# Patient Record
Sex: Female | Born: 1940 | Race: White | Hispanic: No | Smoking: Former smoker
Health system: Southern US, Community
[De-identification: ages and names within clinical notes are randomized; demographics above are authoritative.]

## PROBLEM LIST (undated history)

## (undated) DIAGNOSIS — R3915 Urgency of urination: Secondary | ICD-10-CM

## (undated) DIAGNOSIS — I10 Essential (primary) hypertension: Secondary | ICD-10-CM

## (undated) DIAGNOSIS — K5730 Diverticulosis of large intestine without perforation or abscess without bleeding: Secondary | ICD-10-CM

## (undated) DIAGNOSIS — N201 Calculus of ureter: Secondary | ICD-10-CM

## (undated) DIAGNOSIS — E7800 Pure hypercholesterolemia, unspecified: Secondary | ICD-10-CM

## (undated) DIAGNOSIS — R3129 Other microscopic hematuria: Secondary | ICD-10-CM

## (undated) DIAGNOSIS — Z9889 Other specified postprocedural states: Secondary | ICD-10-CM

## (undated) DIAGNOSIS — Z8619 Personal history of other infectious and parasitic diseases: Secondary | ICD-10-CM

## (undated) DIAGNOSIS — M8580 Other specified disorders of bone density and structure, unspecified site: Secondary | ICD-10-CM

## (undated) DIAGNOSIS — Z8709 Personal history of other diseases of the respiratory system: Secondary | ICD-10-CM

## (undated) DIAGNOSIS — R350 Frequency of micturition: Secondary | ICD-10-CM

## (undated) DIAGNOSIS — Z85820 Personal history of malignant melanoma of skin: Secondary | ICD-10-CM

## (undated) HISTORY — DX: Essential (primary) hypertension: I10

## (undated) HISTORY — DX: Other specified disorders of bone density and structure, unspecified site: M85.80

## (undated) HISTORY — DX: Pure hypercholesterolemia, unspecified: E78.00

## (undated) HISTORY — PX: COSMETIC SURGERY: SHX468

---

## 1974-11-23 HISTORY — PX: OTHER SURGICAL HISTORY: SHX169

## 1981-11-23 HISTORY — PX: VAGINAL HYSTERECTOMY: SUR661

## 1998-06-28 ENCOUNTER — Other Ambulatory Visit: Admission: RE | Admit: 1998-06-28 | Discharge: 1998-06-28 | Payer: Self-pay | Admitting: Obstetrics and Gynecology

## 1999-08-20 ENCOUNTER — Ambulatory Visit (HOSPITAL_BASED_OUTPATIENT_CLINIC_OR_DEPARTMENT_OTHER): Admission: RE | Admit: 1999-08-20 | Discharge: 1999-08-20 | Payer: Self-pay

## 1999-10-01 ENCOUNTER — Other Ambulatory Visit: Admission: RE | Admit: 1999-10-01 | Discharge: 1999-10-01 | Payer: Self-pay | Admitting: Obstetrics and Gynecology

## 1999-11-24 HISTORY — PX: BREAST BIOPSY: SHX20

## 2000-11-19 ENCOUNTER — Other Ambulatory Visit: Admission: RE | Admit: 2000-11-19 | Discharge: 2000-11-19 | Payer: Self-pay | Admitting: Obstetrics and Gynecology

## 2001-08-30 ENCOUNTER — Encounter: Payer: Self-pay | Admitting: Obstetrics and Gynecology

## 2001-08-30 ENCOUNTER — Encounter: Admission: RE | Admit: 2001-08-30 | Discharge: 2001-08-30 | Payer: Self-pay | Admitting: Obstetrics and Gynecology

## 2001-12-27 ENCOUNTER — Other Ambulatory Visit: Admission: RE | Admit: 2001-12-27 | Discharge: 2001-12-27 | Payer: Self-pay | Admitting: Obstetrics and Gynecology

## 2002-09-05 ENCOUNTER — Encounter: Admission: RE | Admit: 2002-09-05 | Discharge: 2002-09-05 | Payer: Self-pay | Admitting: Obstetrics and Gynecology

## 2002-09-05 ENCOUNTER — Encounter: Payer: Self-pay | Admitting: Obstetrics and Gynecology

## 2002-09-11 ENCOUNTER — Observation Stay (HOSPITAL_COMMUNITY): Admission: RE | Admit: 2002-09-11 | Discharge: 2002-09-12 | Payer: Self-pay | Admitting: Urology

## 2002-09-11 HISTORY — PX: BLADDER SUSPENSION: SHX72

## 2003-09-11 ENCOUNTER — Encounter: Payer: Self-pay | Admitting: Obstetrics and Gynecology

## 2003-09-11 ENCOUNTER — Encounter: Admission: RE | Admit: 2003-09-11 | Discharge: 2003-09-11 | Payer: Self-pay | Admitting: Obstetrics and Gynecology

## 2004-09-15 ENCOUNTER — Encounter: Admission: RE | Admit: 2004-09-15 | Discharge: 2004-09-15 | Payer: Self-pay | Admitting: Obstetrics and Gynecology

## 2004-11-23 HISTORY — PX: CATARACT EXTRACTION W/ INTRAOCULAR LENS  IMPLANT, BILATERAL: SHX1307

## 2005-10-01 ENCOUNTER — Encounter: Admission: RE | Admit: 2005-10-01 | Discharge: 2005-10-01 | Payer: Self-pay | Admitting: Obstetrics and Gynecology

## 2006-04-21 ENCOUNTER — Other Ambulatory Visit: Admission: RE | Admit: 2006-04-21 | Discharge: 2006-04-21 | Payer: Self-pay | Admitting: Obstetrics and Gynecology

## 2006-10-13 ENCOUNTER — Encounter: Admission: RE | Admit: 2006-10-13 | Discharge: 2006-10-13 | Payer: Self-pay | Admitting: Obstetrics and Gynecology

## 2007-10-17 ENCOUNTER — Encounter: Admission: RE | Admit: 2007-10-17 | Discharge: 2007-10-17 | Payer: Self-pay | Admitting: Obstetrics and Gynecology

## 2008-05-16 ENCOUNTER — Other Ambulatory Visit: Admission: RE | Admit: 2008-05-16 | Discharge: 2008-05-16 | Payer: Self-pay | Admitting: Obstetrics and Gynecology

## 2008-08-30 ENCOUNTER — Encounter: Admission: RE | Admit: 2008-08-30 | Discharge: 2008-08-30 | Payer: Self-pay | Admitting: Geriatric Medicine

## 2008-09-05 ENCOUNTER — Ambulatory Visit (HOSPITAL_COMMUNITY): Admission: RE | Admit: 2008-09-05 | Discharge: 2008-09-05 | Payer: Self-pay | Admitting: Geriatric Medicine

## 2008-10-24 ENCOUNTER — Encounter: Admission: RE | Admit: 2008-10-24 | Discharge: 2008-10-24 | Payer: Self-pay | Admitting: Obstetrics and Gynecology

## 2009-02-28 ENCOUNTER — Ambulatory Visit: Payer: Self-pay | Admitting: Internal Medicine

## 2009-03-21 ENCOUNTER — Ambulatory Visit: Payer: Self-pay | Admitting: Internal Medicine

## 2009-03-21 ENCOUNTER — Encounter: Payer: Self-pay | Admitting: Internal Medicine

## 2009-03-21 HISTORY — PX: COLONOSCOPY: SHX174

## 2009-03-23 ENCOUNTER — Encounter: Payer: Self-pay | Admitting: Internal Medicine

## 2009-05-22 ENCOUNTER — Telehealth: Payer: Self-pay | Admitting: Internal Medicine

## 2009-05-22 ENCOUNTER — Ambulatory Visit: Payer: Self-pay | Admitting: Obstetrics and Gynecology

## 2009-10-28 ENCOUNTER — Encounter: Admission: RE | Admit: 2009-10-28 | Discharge: 2009-10-28 | Payer: Self-pay | Admitting: Obstetrics and Gynecology

## 2010-05-30 ENCOUNTER — Other Ambulatory Visit: Admission: RE | Admit: 2010-05-30 | Discharge: 2010-05-30 | Payer: Self-pay | Admitting: Obstetrics and Gynecology

## 2010-05-30 ENCOUNTER — Ambulatory Visit: Payer: Self-pay | Admitting: Obstetrics and Gynecology

## 2010-10-29 ENCOUNTER — Encounter
Admission: RE | Admit: 2010-10-29 | Discharge: 2010-10-29 | Payer: Self-pay | Source: Home / Self Care | Admitting: Obstetrics and Gynecology

## 2011-04-10 NOTE — Op Note (Signed)
NAME:  Kendra Cochran, Kendra Cochran                       ACCOUNT NO.:  [id number]   MEDICAL RECORD NO.:  [id number]                   PATIENT TYPE:  AMB   LOCATION:  DAY                                  FACILITY:  [facility]   PHYSICIAN:  Sigmund I. Patsi Sears, M.D.         DATE OF BIRTH:  [birth year]   DATE OF PROCEDURE:  09/11/2002  DATE OF DISCHARGE:                                 OPERATIVE REPORT   PREOPERATIVE DIAGNOSIS:  Urinary incontinence.   POSTOPERATIVE DIAGNOSIS:  Urinary incontinence.   OPERATION:  Cystourethroscopy and SPARC pubovaginal sling.   SURGEON:  Sigmund I. Patsi Sears, M.D.   ANESTHESIA:  General (LMA).   PREPARATION:  After appropriate preanesthesia, the patient is brought to the  operating room and placed on the operating table in the dorsal supine  position where general LMA anesthesia was introduced.  She was then re-  placed in the dorsal lithotomy position where B&O suppository was placed,  and the patient was prepped with Betadine solution and draped in the usual  fashion.   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  Because of the patient's  history of urinary incontinence, it was selected to proceed with [facility]  urethropexy, as previously planned, and 5 cc of Marcaine with epinephrine  was injected into the pubovaginal and cervical arch tissue.  In the midline,  a 2 cm incision was made, and the subcutaneous tissue was dissected with  sharp and blunt dissection on either side of the urethra.  Two separate  incisions were made two fingerbreadths above the pubic tubercle and the  SPARC needles passed.  Cystoscopy was accomplished and showed no evidence of  any SPARC needle within the bladder.  The [facility] system was placed without  difficulty.  The patient tolerated this portion of the procedure well.  A  right-angle  clamp was placed behind the sling to allow tension adjustment  and to be sure that the sling was not too tight.  The sling was then cut in  the usual fashion below the level of the subcutaneous tissue.  Cystoscopy  again revealed no evidence of the sling within the bladder.  There was some  skin edge bleeding in the vaginal epithelium, and this was stopped with  running closure of the vaginal incision with 2-0 Vicryl suture.  Following  this, the packing was placed, Foley catheter placed, and the suprapubic skin  edges taped with Benzoin and Steri-Strips.  The patient was then awakened  and taken to the recovery room in good condition.  She was given IV Ancef at  the end of the case, IV Toradol at the end of the case, B&O suppository.  Sigmund I. Patsi Sears, M.D.    SIT/MEDQ  D:  09/11/2002  T:  09/11/2002  Job:  [id number]

## 2011-05-08 ENCOUNTER — Other Ambulatory Visit: Payer: Self-pay | Admitting: Dermatology

## 2011-06-03 ENCOUNTER — Other Ambulatory Visit (HOSPITAL_COMMUNITY)
Admission: RE | Admit: 2011-06-03 | Discharge: 2011-06-03 | Disposition: A | Discharge status: Home / Self Care | Payer: Medicare Other | Source: Ambulatory Visit | Attending: Obstetrics and Gynecology | Admitting: Obstetrics and Gynecology

## 2011-06-03 ENCOUNTER — Other Ambulatory Visit: Payer: Self-pay | Admitting: Obstetrics and Gynecology

## 2011-06-03 ENCOUNTER — Encounter (INDEPENDENT_AMBULATORY_CARE_PROVIDER_SITE_OTHER): Payer: Medicare Other | Admitting: Obstetrics and Gynecology

## 2011-06-03 DIAGNOSIS — Z124 Encounter for screening for malignant neoplasm of cervix: Secondary | ICD-10-CM | POA: Insufficient documentation

## 2011-06-03 DIAGNOSIS — R82998 Other abnormal findings in urine: Secondary | ICD-10-CM

## 2011-06-03 DIAGNOSIS — M810 Age-related osteoporosis without current pathological fracture: Secondary | ICD-10-CM

## 2011-06-03 DIAGNOSIS — N393 Stress incontinence (female) (male): Secondary | ICD-10-CM

## 2011-06-03 DIAGNOSIS — N952 Postmenopausal atrophic vaginitis: Secondary | ICD-10-CM

## 2011-09-24 ENCOUNTER — Other Ambulatory Visit: Payer: Self-pay | Admitting: Obstetrics and Gynecology

## 2011-09-24 DIAGNOSIS — Z1231 Encounter for screening mammogram for malignant neoplasm of breast: Secondary | ICD-10-CM

## 2011-11-02 ENCOUNTER — Ambulatory Visit
Admission: RE | Admit: 2011-11-02 | Discharge: 2011-11-02 | Disposition: A | Discharge status: Home / Self Care | Payer: Medicare Other | Source: Ambulatory Visit | Attending: Obstetrics and Gynecology | Admitting: Obstetrics and Gynecology

## 2011-11-02 DIAGNOSIS — Z1231 Encounter for screening mammogram for malignant neoplasm of breast: Secondary | ICD-10-CM

## 2012-05-09 ENCOUNTER — Other Ambulatory Visit: Payer: Self-pay | Admitting: Dermatology

## 2012-05-16 ENCOUNTER — Other Ambulatory Visit: Payer: Self-pay | Admitting: *Deleted

## 2012-05-16 MED ORDER — ZOLPIDEM TARTRATE 10 MG PO TABS: 10.0000 mg | ORAL_TABLET | Freq: Every evening | ORAL | Status: DC | PRN

## 2012-05-16 NOTE — Telephone Encounter (Signed)
Refill called in to [place] 

## 2012-05-25 ENCOUNTER — Encounter: Payer: Self-pay | Admitting: Obstetrics and Gynecology

## 2012-05-25 DIAGNOSIS — N946 Dysmenorrhea, unspecified: Secondary | ICD-10-CM | POA: Insufficient documentation

## 2012-05-25 DIAGNOSIS — C439 Malignant melanoma of skin, unspecified: Secondary | ICD-10-CM | POA: Insufficient documentation

## 2012-06-17 ENCOUNTER — Other Ambulatory Visit: Payer: Self-pay | Admitting: Dermatology

## 2012-06-21 ENCOUNTER — Encounter: Payer: Medicare Other | Admitting: Obstetrics and Gynecology

## 2012-06-30 ENCOUNTER — Ambulatory Visit (INDEPENDENT_AMBULATORY_CARE_PROVIDER_SITE_OTHER): Payer: Medicare Other | Admitting: Obstetrics and Gynecology

## 2012-06-30 ENCOUNTER — Encounter: Payer: Self-pay | Admitting: Obstetrics and Gynecology

## 2012-06-30 VITALS — BP 120/76 | Ht 60.0 in | Wt 107.0 lb

## 2012-06-30 DIAGNOSIS — Z780 Asymptomatic menopausal state: Secondary | ICD-10-CM

## 2012-06-30 DIAGNOSIS — N393 Stress incontinence (female) (male): Secondary | ICD-10-CM

## 2012-06-30 DIAGNOSIS — N952 Postmenopausal atrophic vaginitis: Secondary | ICD-10-CM

## 2012-06-30 DIAGNOSIS — M899 Disorder of bone, unspecified: Secondary | ICD-10-CM

## 2012-06-30 DIAGNOSIS — M8580 Other specified disorders of bone density and structure, unspecified site: Secondary | ICD-10-CM

## 2012-06-30 MED ORDER — ZOLPIDEM TARTRATE 10 MG PO TABS: 10.0000 mg | ORAL_TABLET | Freq: Every evening | ORAL | Status: DC | PRN

## 2012-06-30 MED ORDER — DIAZEPAM 5 MG PO TABS: 5.0000 mg | ORAL_TABLET | Freq: Four times a day (QID) | ORAL | Status: AC | PRN

## 2012-06-30 NOTE — Patient Instructions (Signed)
Schedule bone density.    

## 2012-06-30 NOTE — Progress Notes (Signed)
The patient came to see me today for further follow up. She has had osteoporosis on bone density. She was treated with biphosphonate's for 10 years and now on drug holiday for 2 years. Her last bone density was 2011 and was now osteopenia. She is due for followup bone density. She is up-to-date on mammograms. She has significant sleep disturbance which started after her husband's death. She uses one third of the 10 mg Ambien with excellent results. She also uses occasional Valium for stress. She does have urinary stress incontinence which she feels is tolerable. She does not have urgency, frequency or dysuria. She is having no vaginal bleeding. She is having no pelvic pain. She had a vaginal hysterectomy for dysmenorrhea. She has always had normal Pap smears. Her last Pap smear was 2012. She does have atrophic vaginitis but is not sexually active.  ROS: 12 system review done. Pertinent positives above.  HEENT: Within normal limits. Neck: No masses. Supraclavicular lymph nodes: Not enlarged. Breasts: Examined in both sitting and lying position. Symmetrical without skin changes or masses. Abdomen: Soft no masses guarding or rebound. No hernias. Pelvic: External within normal limits. BUS within normal limits. Vaginal examination shows poor  estrogen effect, no cystocele enterocele or rectocele. Cervix and uterus absent. Adnexa within normal limits. Rectovaginal confirmatory. Extremities within normal limits.  Assessment: #1. Sleep disturbance-probably stress rather than menopausal #2. Atrophic vaginitis #3. Osteopenia #4. Urinary stress incontinence  Plan: Continue yearly mammograms. Bone density. Continue Ambien as needed.

## 2012-07-06 ENCOUNTER — Encounter: Payer: Self-pay | Admitting: Obstetrics and Gynecology

## 2012-09-23 ENCOUNTER — Other Ambulatory Visit: Payer: Self-pay | Admitting: Obstetrics and Gynecology

## 2012-09-23 DIAGNOSIS — Z1231 Encounter for screening mammogram for malignant neoplasm of breast: Secondary | ICD-10-CM

## 2012-09-23 DIAGNOSIS — M8580 Other specified disorders of bone density and structure, unspecified site: Secondary | ICD-10-CM

## 2012-09-23 HISTORY — DX: Other specified disorders of bone density and structure, unspecified site: M85.80

## 2012-10-06 ENCOUNTER — Encounter: Payer: Self-pay | Admitting: Obstetrics and Gynecology

## 2012-11-02 ENCOUNTER — Ambulatory Visit
Admission: RE | Admit: 2012-11-02 | Discharge: 2012-11-02 | Disposition: A | Discharge status: Home / Self Care | Payer: Medicare Other | Source: Ambulatory Visit | Attending: Obstetrics and Gynecology | Admitting: Obstetrics and Gynecology

## 2012-11-02 DIAGNOSIS — Z1231 Encounter for screening mammogram for malignant neoplasm of breast: Secondary | ICD-10-CM

## 2012-11-04 ENCOUNTER — Telehealth: Payer: Self-pay | Admitting: *Deleted

## 2012-11-04 NOTE — Telephone Encounter (Signed)
Pt informed with normal mammogram results for 11/02/12.

## 2013-02-20 ENCOUNTER — Other Ambulatory Visit: Payer: Self-pay | Admitting: Sports Medicine

## 2013-02-20 DIAGNOSIS — M545 Low back pain: Secondary | ICD-10-CM

## 2013-02-22 ENCOUNTER — Ambulatory Visit
Admission: RE | Admit: 2013-02-22 | Discharge: 2013-02-22 | Disposition: A | Discharge status: Home / Self Care | Payer: Medicare Other | Source: Ambulatory Visit | Attending: Sports Medicine | Admitting: Sports Medicine

## 2013-02-22 DIAGNOSIS — M545 Low back pain: Secondary | ICD-10-CM

## 2013-04-14 ENCOUNTER — Other Ambulatory Visit: Payer: Self-pay

## 2013-04-14 MED ORDER — ZOLPIDEM TARTRATE 10 MG PO TABS: 10.0000 mg | ORAL_TABLET | Freq: Every evening | ORAL | Status: DC | PRN

## 2013-04-14 NOTE — Telephone Encounter (Signed)
Patient is former patient of Dr. Verl Dicker. She is current with her yearly having had her last one in August 2013.  At that CE visit Dr. Reece Agar wrote: Assessment: #1. Sleep disturbance-probably stress rather than menopausal #2. Atrophic vaginitis #3. Osteopenia #4. Urinary stress incontinence  Plan: Continue yearly mammograms. Bone density. Continue Ambien as needed.            Medications Ordered This Encounter      Disp Refills Start End   zolpidem (AMBIEN) 10 MG tablet 30 tablet 4 06/30/2012    Take 1 tablet (10 mg total) by mouth at bedtime as needed. - Oral

## 2013-04-14 NOTE — Telephone Encounter (Signed)
Phoned in to pharmacy. 

## 2013-08-21 ENCOUNTER — Encounter: Payer: Self-pay | Admitting: Gynecology

## 2013-08-21 ENCOUNTER — Ambulatory Visit (INDEPENDENT_AMBULATORY_CARE_PROVIDER_SITE_OTHER): Payer: Medicare Other | Admitting: Gynecology

## 2013-08-21 VITALS — BP 110/70 | Ht 60.0 in | Wt 114.0 lb

## 2013-08-21 DIAGNOSIS — M8580 Other specified disorders of bone density and structure, unspecified site: Secondary | ICD-10-CM

## 2013-08-21 DIAGNOSIS — G4700 Insomnia, unspecified: Secondary | ICD-10-CM

## 2013-08-21 DIAGNOSIS — N899 Noninflammatory disorder of vagina, unspecified: Secondary | ICD-10-CM

## 2013-08-21 DIAGNOSIS — N898 Other specified noninflammatory disorders of vagina: Secondary | ICD-10-CM

## 2013-08-21 DIAGNOSIS — N952 Postmenopausal atrophic vaginitis: Secondary | ICD-10-CM

## 2013-08-21 DIAGNOSIS — M899 Disorder of bone, unspecified: Secondary | ICD-10-CM

## 2013-08-21 LAB — URINALYSIS W MICROSCOPIC + REFLEX CULTURE
Bilirubin Urine: NEGATIVE
Casts: NONE SEEN
Crystals: NONE SEEN
Nitrite: NEGATIVE
Urobilinogen, UA: 0.2 mg/dL (ref 0.0–1.0)
pH: 5.5 (ref 5.0–8.0)

## 2013-08-21 MED ORDER — DIAZEPAM 5 MG PO TABS: 5.0000 mg | ORAL_TABLET | Freq: Four times a day (QID) | ORAL | Status: DC | PRN

## 2013-08-21 MED ORDER — ZOLPIDEM TARTRATE 10 MG PO TABS: 10.0000 mg | ORAL_TABLET | Freq: Every evening | ORAL | Status: DC | PRN

## 2013-08-21 NOTE — Progress Notes (Signed)
Kendra Cochran 12/01/1940 [id number]        72 y.o.  [id number] for followup exam.  Former patient of Dr. Eda Cochran. Several issues noted below.  Past medical history,surgical history, medications, allergies, family history and social history were all reviewed and documented in the EPIC chart.  ROS:  Performed and pertinent positives and negatives are included in the history, assessment and plan .  Exam: Kendra Cochran assistant Filed Vitals:   08/21/13 0925  BP: 110/70  Height: 5' (1.524 m)  Weight: 114 lb (51.71 kg)   General appearance  Normal Skin grossly normal Head/Neck normal with no cervical or supraclavicular adenopathy thyroid normal Lungs  clear Cardiac RR, without RMG Abdominal  soft, nontender, without masses, organomegaly or hernia Breasts  examined lying and sitting without masses, retractions, discharge or axillary adenopathy. Pelvic  Ext/BUS/vagina  atrophic genital changes  Adnexa  Without masses or tenderness    Anus and perineum  normal   Rectovaginal  normal sphincter tone without palpated masses or tenderness.    Assessment/Plan:  72 y.o. [id number] female for followup exam  1. Atrophic genital changes. Status post TVH for dysmenorrhea. Patient is complaining of some vaginal irritation, low level. No discharge, odor, urinary symptoms such as frequency dysuria or urgency. Exam shows significant atrophy without evidence of infection. We'll check urinalysis. Discussed options to include OTC moisturizers such as Replens, vaginal estrogen such as Estrace cream or Vagifem and Osphena. The pros/cons, risks/benefits of each choice discussed including issues of absorption and increased risk of blood clots such as stroke heart attack DVT in the breast cancer issue. Patient prefers to try Replens. She will call back if she wants to consider trial of Vagifem. 2. Osteopenia. Last bone density 09/2012 with T score -2.3. Had been labeled as osteoporotic in Dr. Verl Cochran last note although  review of all of her DEXA's in her chart showed the lowest T score -2.4. Had been on bisphosphonate for 10 years and now is 3 years off of this for a drug-free holiday again per Dr. Verl Cochran note. 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 Recommend repeat DEXA next year to year interval. Increase calcium vitamin D reviewed. 3. Urinary incontinence. Patient has some loss of urine particularly after finishing voiding when she stands she still lives additional little bit of urine. No real SUI symptoms. She is status post sling 2003. Is not interested in following up with urology in reference to this at this time. We'll check urinalysis. Otherwise continue to follow. 4. Mammography 10/2012. Patient will continue with annual mammography. SBE monthly reviewed. 5. Pap smear 2012. Status post hysterectomy for benign indications. Over the age of 28. No history of abnormal Pap smears. Options to stop screening altogether reviewed the patient is comfortable with this. 6. Colonoscopy 2010. Repeat at their recommended interval. 7. Insomnia. Since the death of her husband several years ago. Uses a third of a 10 mg Ambien as needed. Also uses Valium 5 mg when she travels intermittently for anxiety and sleep. Has been doing this for years with good results. Refill Ambien 10 mg #30 and Valium 5 mg #30 both with 2 refills. 8. Health maintenance. No blood work done as this is all done through her primary physician's office. Followup one year, sooner as needed.  Note: This document was prepared with digital dictation and possible smart phrase technology. Any transcriptional errors that result from this process are unintentional.   Kendra Lords MD, 9:56 AM  08/21/2013   

## 2013-08-21 NOTE — Patient Instructions (Signed)
Follow up in one year, sooner as needed. 

## 2013-08-23 LAB — URINE CULTURE: Colony Count: 2000

## 2013-12-01 ENCOUNTER — Encounter: Payer: Self-pay | Admitting: Gynecology

## 2014-09-24 ENCOUNTER — Encounter: Payer: Self-pay | Admitting: Gynecology

## 2014-10-31 ENCOUNTER — Encounter: Payer: Medicare Other | Admitting: Gynecology

## 2014-11-01 ENCOUNTER — Encounter: Payer: Self-pay | Admitting: Gynecology

## 2014-11-01 ENCOUNTER — Ambulatory Visit (INDEPENDENT_AMBULATORY_CARE_PROVIDER_SITE_OTHER): Payer: Medicare Other | Admitting: Gynecology

## 2014-11-01 ENCOUNTER — Other Ambulatory Visit: Payer: Self-pay | Admitting: Gynecology

## 2014-11-01 ENCOUNTER — Telehealth: Payer: Self-pay | Admitting: *Deleted

## 2014-11-01 ENCOUNTER — Ambulatory Visit (INDEPENDENT_AMBULATORY_CARE_PROVIDER_SITE_OTHER): Payer: Medicare Other

## 2014-11-01 DIAGNOSIS — R102 Pelvic and perineal pain: Secondary | ICD-10-CM

## 2014-11-01 DIAGNOSIS — G4700 Insomnia, unspecified: Secondary | ICD-10-CM

## 2014-11-01 DIAGNOSIS — N8331 Acquired atrophy of ovary: Secondary | ICD-10-CM

## 2014-11-01 DIAGNOSIS — N83202 Unspecified ovarian cyst, left side: Secondary | ICD-10-CM

## 2014-11-01 DIAGNOSIS — N200 Calculus of kidney: Secondary | ICD-10-CM

## 2014-11-01 DIAGNOSIS — N83319 Acquired atrophy of ovary, unspecified side: Secondary | ICD-10-CM

## 2014-11-01 DIAGNOSIS — N8320 Unspecified ovarian cysts: Secondary | ICD-10-CM

## 2014-11-01 LAB — URINALYSIS W MICROSCOPIC + REFLEX CULTURE
Bilirubin Urine: NEGATIVE
Casts: NONE SEEN
Crystals: NONE SEEN
GLUCOSE, UA: NEGATIVE mg/dL
Ketones, ur: NEGATIVE mg/dL
NITRITE: NEGATIVE
PROTEIN: NEGATIVE mg/dL
SPECIFIC GRAVITY, URINE: 1.02 (ref 1.005–1.030)
UROBILINOGEN UA: 0.2 mg/dL (ref 0.0–1.0)
pH: 6 (ref 5.0–8.0)

## 2014-11-01 MED ORDER — CIPROFLOXACIN HCL 250 MG PO TABS: 250.0000 mg | ORAL_TABLET | Freq: Two times a day (BID) | ORAL | Status: DC

## 2014-11-01 MED ORDER — DIAZEPAM 5 MG PO TABS: 5.0000 mg | ORAL_TABLET | Freq: Four times a day (QID) | ORAL | Status: DC | PRN

## 2014-11-01 MED ORDER — ZOLPIDEM TARTRATE 10 MG PO TABS: 10.0000 mg | ORAL_TABLET | Freq: Every evening | ORAL | Status: DC | PRN

## 2014-11-01 NOTE — Telephone Encounter (Signed)
Referral form faxed to [facility] they will fax me back with time and date of appointment.

## 2014-11-01 NOTE — Telephone Encounter (Signed)
-----   Message from Anastasio Auerbach, MD sent at 11/01/2014  2:50 PM EST ----- Patient needs appointment to see urologist. Preferably Dr. Gaynelle Arabian as she has seen him before. Reference probable renal lithiasis with ongoing blood in her urine.

## 2014-11-01 NOTE — Addendum Note (Signed)
Addended by: Anastasio Auerbach on: 11/01/2014 04:05 PM   Modules accepted: Orders, Level of Service

## 2014-11-01 NOTE — Patient Instructions (Signed)
Follow up for ultrasound as scheduled. Office will arrange appointment with urologist. Take antibiotic twice daily for 7 days.

## 2014-11-01 NOTE — Progress Notes (Addendum)
Kendra Cochran 01-01-1941 [id number]        73 y.o.  [id number] patient presents  Complaining of both left lower and right lower quadrant pain and some low back pain. Initially had crippling low back to left lower quadrant pain 2 weeks ago. Noticed blood-tinged urine. Symptoms seem to get better but now have waxed and waned over the last week or 2. No longer having blood in her urine. No constipation diarrhea. No fever chills nausea vomiting. Eating without difficulty. No history of renal lithiasis. Status post TVH in the past.  Past medical history,surgical history, problem list, medications, allergies, family history and social history were all reviewed and documented in the EPIC chart.  Directed ROS with pertinent positives and negatives documented in the history of present illness/assessment and plan.  Exam: [name] General appearance:  Normal Spine straight without CVA tenderness Abdomen soft nontender without masses guarding rebound organomegaly Pelvic external BS vagina with atrophic changes. Bimanual without masses or tenderness. Rectovaginal exam is normal.  Urinalysis shows rare squamous, 3-6 WBC, TNTC RBC, few bacteria  Assessment/Plan:  73 y.o. [id number] with history and urinalysis suspicious for renal lithiasis. Will cover with 50 mg twice a day 7 days. Follow up appointment with urologist for further evaluation. Schedule ultrasound here to rule out ovarian process as patient raised the concern about ovarian cancer. Issues with urologic cancers also reviewed with need to see the urologist for evaluation discussed.   Addendum: Patient was able to arrange ultrasound today. Ultrasound shows right ovary atrophic. Left ovary with some calcifications and irregular shaped echo-free cyst 10 mm mean avascular. Minimal fluid in the cul-de-sac.  Assessment and plan: Probable physiologic changes left ovary persistent from premenopausal. Simple small avascular cyst. Doubt responsible for  her pain. For completeness I recommend repeat ultrasound in 2 months. Will start on the antibiotics and see the urologist as noted above. Various scenarios to include enlarging cyst requiring surgery or laparoscopy due to persistent pain despite negative urologic evaluation also discussed.  Patient will schedule her ultrasound today for 2 months from now.  Anastasio Auerbach MD, 2:46 PM 11/01/2014

## 2014-11-03 LAB — URINE CULTURE
Colony Count: NO GROWTH
ORGANISM ID, BACTERIA: NO GROWTH

## 2014-11-05 NOTE — Telephone Encounter (Signed)
Appointment on 12/17/14 @ 3:30 pt informed with this as well.

## 2014-11-05 NOTE — Telephone Encounter (Signed)
Dr.Fontaine pt asked if she could have something for pain as well? Pt said she is having some lower slight discomfort and asked if she could have Rx for pain to have on hand? Please advise

## 2014-11-06 MED ORDER — IBUPROFEN 800 MG PO TABS: 800.0000 mg | ORAL_TABLET | Freq: Three times a day (TID) | ORAL | Status: AC | PRN

## 2014-11-06 NOTE — Telephone Encounter (Signed)
Ibuprofen 800 mg #30 one by mouth every 8 hour period

## 2014-11-06 NOTE — Telephone Encounter (Signed)
Rx sent to [facility]aramacy.  

## 2014-11-10 ENCOUNTER — Encounter (HOSPITAL_COMMUNITY): Payer: Self-pay

## 2014-11-10 ENCOUNTER — Emergency Department (HOSPITAL_COMMUNITY)
Admission: EM | Admit: 2014-11-10 | Discharge: 2014-11-10 | Disposition: A | Discharge status: Home / Self Care | Payer: Medicare Other | Attending: Emergency Medicine | Admitting: Emergency Medicine

## 2014-11-10 ENCOUNTER — Emergency Department (HOSPITAL_COMMUNITY): Payer: Medicare Other

## 2014-11-10 DIAGNOSIS — Z85820 Personal history of malignant melanoma of skin: Secondary | ICD-10-CM | POA: Insufficient documentation

## 2014-11-10 DIAGNOSIS — N200 Calculus of kidney: Secondary | ICD-10-CM | POA: Insufficient documentation

## 2014-11-10 DIAGNOSIS — M8580 Other specified disorders of bone density and structure, unspecified site: Secondary | ICD-10-CM | POA: Diagnosis not present

## 2014-11-10 DIAGNOSIS — Z792 Long term (current) use of antibiotics: Secondary | ICD-10-CM | POA: Insufficient documentation

## 2014-11-10 DIAGNOSIS — Z87891 Personal history of nicotine dependence: Secondary | ICD-10-CM | POA: Diagnosis not present

## 2014-11-10 DIAGNOSIS — Z79899 Other long term (current) drug therapy: Secondary | ICD-10-CM | POA: Diagnosis not present

## 2014-11-10 DIAGNOSIS — R109 Unspecified abdominal pain: Secondary | ICD-10-CM

## 2014-11-10 LAB — CBC WITH DIFFERENTIAL/PLATELET
Basophils Absolute: 0 10*3/uL (ref 0.0–0.1)
Basophils Relative: 0 % (ref 0–1)
EOS ABS: 0.1 10*3/uL (ref 0.0–0.7)
Eosinophils Relative: 2 % (ref 0–5)
HEMATOCRIT: 43.6 % (ref 36.0–46.0)
HEMOGLOBIN: 14.6 g/dL (ref 12.0–15.0)
LYMPHS ABS: 1.4 10*3/uL (ref 0.7–4.0)
Lymphocytes Relative: 25 % (ref 12–46)
MCH: 30.8 pg (ref 26.0–34.0)
MCHC: 33.5 g/dL (ref 30.0–36.0)
MCV: 92 fL (ref 78.0–100.0)
MONO ABS: 0.3 10*3/uL (ref 0.1–1.0)
MONOS PCT: 5 % (ref 3–12)
Neutro Abs: 3.8 10*3/uL (ref 1.7–7.7)
Neutrophils Relative %: 68 % (ref 43–77)
Platelets: 144 10*3/uL — ABNORMAL LOW (ref 150–400)
RBC: 4.74 MIL/uL (ref 3.87–5.11)
RDW: 13.2 % (ref 11.5–15.5)
WBC: 5.5 10*3/uL (ref 4.0–10.5)

## 2014-11-10 LAB — COMPREHENSIVE METABOLIC PANEL
ALBUMIN: 4.4 g/dL (ref 3.5–5.2)
ALT: 20 U/L (ref 0–35)
ANION GAP: 13 (ref 5–15)
AST: 23 U/L (ref 0–37)
Alkaline Phosphatase: 57 U/L (ref 39–117)
BILIRUBIN TOTAL: 0.4 mg/dL (ref 0.3–1.2)
BUN: 18 mg/dL (ref 6–23)
CO2: 25 mEq/L (ref 19–32)
CREATININE: 1.1 mg/dL (ref 0.50–1.10)
Calcium: 10.2 mg/dL (ref 8.4–10.5)
Chloride: 104 mEq/L (ref 96–112)
GFR calc non Af Amer: 48 mL/min — ABNORMAL LOW (ref >90)
GFR, EST AFRICAN AMERICAN: 55 mL/min — AB (ref >90)
GLUCOSE: 102 mg/dL — AB (ref 70–99)
Potassium: 4.1 mEq/L (ref 3.7–5.3)
Sodium: 142 mEq/L (ref 137–147)
Total Protein: 7.3 g/dL (ref 6.0–8.3)

## 2014-11-10 LAB — URINALYSIS, ROUTINE W REFLEX MICROSCOPIC
Bilirubin Urine: NEGATIVE
GLUCOSE, UA: NEGATIVE mg/dL
KETONES UR: NEGATIVE mg/dL
Nitrite: NEGATIVE
PH: 5.5 (ref 5.0–8.0)
Protein, ur: NEGATIVE mg/dL
Specific Gravity, Urine: 1.026 (ref 1.005–1.030)
Urobilinogen, UA: 0.2 mg/dL (ref 0.0–1.0)

## 2014-11-10 LAB — URINE MICROSCOPIC-ADD ON

## 2014-11-10 MED ORDER — TAMSULOSIN HCL 0.4 MG PO CAPS: 0.4000 mg | ORAL_CAPSULE | Freq: Every day | ORAL | Status: DC

## 2014-11-10 MED ORDER — HYDROCODONE-ACETAMINOPHEN 5-325 MG PO TABS
2.0000 | ORAL_TABLET | Freq: Once | ORAL | Status: AC
  Administered 2014-11-10: 2 via ORAL
  Filled 2014-11-10: qty 2

## 2014-11-10 MED ORDER — HYDROCODONE-ACETAMINOPHEN 5-325 MG PO TABS: 1.0000 | ORAL_TABLET | Freq: Four times a day (QID) | ORAL | Status: DC | PRN

## 2014-11-10 NOTE — ED Provider Notes (Signed)
CSN: [id number]     Arrival date & time 11/10/14  1100 History   First MD Initiated Contact with Patient 11/10/14 1140     Chief Complaint  Patient presents with  . Flank Pain  . Abdominal Pain     (Consider location/radiation/quality/duration/timing/severity/associated sxs/prior Treatment) Patient is a 73 y.o. female presenting with flank pain and abdominal pain. The history is provided by the patient.  Flank Pain This is a recurrent problem. The current episode started more than 1 week ago. The problem occurs every several days. The problem has not changed since onset.Associated symptoms include abdominal pain. Pertinent negatives include no shortness of breath. Nothing aggravates the symptoms. Nothing relieves the symptoms. She has tried nothing for the symptoms.  Abdominal Pain Pain location:  L flank and R flank Pain quality: aching and sharp   Pain radiates to:  Groin Associated symptoms: no cough, no diarrhea, no fever, no nausea, no shortness of breath and no vomiting     Past Medical History  Diagnosis Date  . Melanoma   . Dysmenorrhea   . Osteopenia 09/2012    T score -2.3, 3% decline from prior DEXA statistically significant AP spine, hips stable   Past Surgical History  Procedure Laterality Date  . Vaginal hysterectomy  1983  . Bladder suspension  08/2002  . Excision of melanoma  1976  . Breast biopsy  2001  . Cataract extraction  2006   Family History  Problem Relation Age of Onset  . Heart disease Father   . Breast cancer Paternal Aunt     Age 100's   History  Substance Use Topics  . Smoking status: Former Research scientist (life sciences)  . Smokeless tobacco: Not on file  . Alcohol Use: 1.5 oz/week    3 drink(s) per week   OB History    Gravida Para Term Preterm AB TAB SAB Ectopic Multiple Living   3 2 2  1     2      Review of Systems  Constitutional: Negative for fever.  Respiratory: Negative for cough and shortness of breath.   Gastrointestinal: Positive for abdominal  pain. Negative for nausea, vomiting and diarrhea.  Genitourinary: Positive for flank pain.  All other systems reviewed and are negative.     Allergies  Review of patient's allergies indicates no known allergies.  Home Medications   Prior to Admission medications   Medication Sig Start Date End Date Taking? Authorizing Provider  acetaminophen (TYLENOL) 500 MG tablet Take 1,000 mg by mouth every 6 (six) hours as needed for mild pain or headache.   Yes Historical Provider, MD  calcium-vitamin D (OSCAL WITH D) 500-200 MG-UNIT per tablet Take 1 tablet by mouth 2 (two) times daily.   Yes Historical Provider, MD  diazepam (VALIUM) 5 MG tablet Take 1 tablet (5 mg total) by mouth every 6 (six) hours as needed for anxiety. 11/01/14  Yes Anastasio Auerbach, MD  ibuprofen (ADVIL,MOTRIN) 800 MG tablet Take 1 tablet (800 mg total) by mouth every 8 (eight) hours as needed. Patient taking differently: Take 800 mg by mouth every 8 (eight) hours as needed for moderate pain.  11/06/14  Yes Anastasio Auerbach, MD  Multiple Vitamin (MULTIVITAMIN) capsule Take 1 capsule by mouth daily.   Yes Historical Provider, MD  naproxen sodium (ANAPROX) 220 MG tablet Take 220 mg by mouth every 12 (twelve) hours as needed (for pain).   Yes Historical Provider, MD  zolpidem (AMBIEN) 10 MG tablet Take 1 tablet (10 mg total)  by mouth at bedtime as needed. 11/01/14  Yes Anastasio Auerbach, MD  ciprofloxacin (CIPRO) 250 MG tablet Take 1 tablet (250 mg total) by mouth 2 (two) times daily. For 7 days Patient not taking: Reported on 11/10/2014 11/01/14   Anastasio Auerbach, MD  HYDROcodone-acetaminophen (NORCO/VICODIN) 5-325 MG per tablet Take 1 tablet by mouth every 6 (six) hours as needed for moderate pain. 11/10/14   Evelina Bucy, MD  tamsulosin (FLOMAX) 0.4 MG CAPS capsule Take 1 capsule (0.4 mg total) by mouth daily. 11/10/14   Evelina Bucy, MD   BP 159/78 mmHg  Pulse 74  Temp(Src) 97.8 F (36.6 C) (Oral)  Resp 16  SpO2  100% Physical Exam  Constitutional: She is oriented to person, place, and time. She appears well-developed and well-nourished. No distress.  HENT:  Head: Normocephalic and atraumatic.  Mouth/Throat: Oropharynx is clear and moist.  Eyes: EOM are normal. Pupils are equal, round, and reactive to light.  Neck: Normal range of motion. Neck supple.  Cardiovascular: Normal rate and regular rhythm.  Exam reveals no friction rub.   No murmur heard. Pulmonary/Chest: Effort normal and breath sounds normal. No respiratory distress. She has no wheezes. She has no rales.  Abdominal: Soft. She exhibits no distension. There is no tenderness. There is no rebound.  Musculoskeletal: Normal range of motion. She exhibits no edema.  Neurological: She is alert and oriented to person, place, and time.  Skin: No rash noted. She is not diaphoretic.  Nursing note and vitals reviewed.   ED Course  Procedures (including critical care time) Labs Review Labs Reviewed  URINALYSIS, ROUTINE W REFLEX MICROSCOPIC - Abnormal; Notable for the following:    Color, Urine AMBER (*)    APPearance CLOUDY (*)    Hgb urine dipstick LARGE (*)    Leukocytes, UA TRACE (*)    All other components within normal limits  CBC WITH DIFFERENTIAL - Abnormal; Notable for the following:    Platelets 144 (*)    All other components within normal limits  COMPREHENSIVE METABOLIC PANEL - Abnormal; Notable for the following:    Glucose, Bld 102 (*)    GFR calc non Af Amer 48 (*)    GFR calc Af Amer 55 (*)    All other components within normal limits  URINE MICROSCOPIC-ADD ON    Imaging Review Ct Renal Stone Study  11/10/2014   CLINICAL DATA:  Left flank/groin pain, kidney stones  EXAM: CT ABDOMEN AND PELVIS WITHOUT CONTRAST  TECHNIQUE: Multidetector CT imaging of the abdomen and pelvis was performed following the standard protocol without IV contrast.  COMPARISON:  None.  FINDINGS: Lower chest:  Lung bases are clear.  Hepatobiliary:  Unenhanced liver is unremarkable.  Gallbladder is within normal limits. No intrahepatic or extrahepatic ductal dilatation.  Pancreas: Within normal limits.  Spleen: Within normal limits.  Adrenals/Urinary Tract: Adrenal glands are unremarkable.  Right kidney is within normal limits.  Left kidney is notable for mild hydroureteronephrosis.  4 mm distal left ureteral calculus above the UVJ (coronal image 47).  Stomach/Bowel: Stomach is unremarkable.  No evidence of bowel obstruction.  Normal appendix.  Colonic diverticulosis, without associated inflammatory changes  Vascular/Lymphatic: Atherosclerotic calcifications of the abdominal aorta and branch vessels.  No suspicious abdominopelvic lymphadenopathy.  Reproductive: Status post hysterectomy.  Bilateral ovaries are within normal limits.  Other: No abdominopelvic ascites.  Musculoskeletal: Degenerative changes of the visualized thoracolumbar spine, most prominent at L5-S1.  IMPRESSION: 4 mm distal left ureteral calculus above the UVJ.  Mild left hydroureteronephrosis.   Electronically Signed   By: Julian Hy M.D.   On: 11/10/2014 13:31     EKG Interpretation None      MDM   Final diagnoses:  Abdominal pain  Kidney stone    26F here with bilateral groin pain, intermittent flank pain. Had this for weeks, had workup at GYN's office, UA shows blood, trace leukocytes - thought to be a UTI. [place] of pelvis showed a cyst. Had urology f/u in 6 weeks. Here with worsening pain. No appreciable abdominal pain on exam. Plan for CT scan.  CT shows L sided stone, otherwise normal. Patient given pain meds (is feeling better after them). Given Rx for pain meds and flomax. Patient stable for discharge. I updated her son about the findings over the phone. I have reviewed all labs and imaging and considered them in my medical decision making.   Evelina Bucy, MD 11/10/14 [phone]

## 2014-11-10 NOTE — Discharge Instructions (Signed)

## 2014-11-10 NOTE — ED Notes (Signed)
Per pt, 1 st diagnosed with kidney stone 2-3 weeks ago.  Pt told she had stones left and UTI. Was told to see Urology and has appt in January.  Told to come here if pain continued.  Has finished antibiotic on Thursday.  Pt states pain increased and up all night.  Pain in bilateral groin and more to left side.  No fever, no n/v

## 2014-11-14 ENCOUNTER — Other Ambulatory Visit: Payer: Self-pay | Admitting: Urology

## 2014-11-14 ENCOUNTER — Encounter (HOSPITAL_BASED_OUTPATIENT_CLINIC_OR_DEPARTMENT_OTHER): Payer: Self-pay | Admitting: *Deleted

## 2014-11-14 NOTE — Progress Notes (Addendum)
NPO AFTER MN WITH EXCEPTION CLEAR LIQUIDS UNTIL 0730.  ARRIVE AT 1100. CURRENT LAB RESULTS IN CHART AND EPIC. MAY TAKE HYDROCODONE IF NEEDED AM DOS .  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

## 2014-11-19 ENCOUNTER — Ambulatory Visit (HOSPITAL_BASED_OUTPATIENT_CLINIC_OR_DEPARTMENT_OTHER): Payer: Medicare Other | Admitting: Anesthesiology

## 2014-11-19 ENCOUNTER — Ambulatory Visit (HOSPITAL_BASED_OUTPATIENT_CLINIC_OR_DEPARTMENT_OTHER)
Admission: RE | Admit: 2014-11-19 | Discharge: 2014-11-19 | Disposition: A | Discharge status: Home / Self Care | Payer: Medicare Other | Source: Ambulatory Visit | Attending: Urology | Admitting: Urology

## 2014-11-19 ENCOUNTER — Encounter (HOSPITAL_BASED_OUTPATIENT_CLINIC_OR_DEPARTMENT_OTHER): Payer: Self-pay

## 2014-11-19 ENCOUNTER — Encounter (HOSPITAL_BASED_OUTPATIENT_CLINIC_OR_DEPARTMENT_OTHER)
Admission: RE | Disposition: A | Discharge status: Home / Self Care | Payer: Self-pay | Source: Ambulatory Visit | Attending: Urology

## 2014-11-19 DIAGNOSIS — J45909 Unspecified asthma, uncomplicated: Secondary | ICD-10-CM | POA: Insufficient documentation

## 2014-11-19 DIAGNOSIS — Z85820 Personal history of malignant melanoma of skin: Secondary | ICD-10-CM | POA: Diagnosis not present

## 2014-11-19 DIAGNOSIS — Z79899 Other long term (current) drug therapy: Secondary | ICD-10-CM | POA: Insufficient documentation

## 2014-11-19 DIAGNOSIS — N201 Calculus of ureter: Secondary | ICD-10-CM | POA: Insufficient documentation

## 2014-11-19 DIAGNOSIS — Z87891 Personal history of nicotine dependence: Secondary | ICD-10-CM | POA: Insufficient documentation

## 2014-11-19 HISTORY — DX: Personal history of other diseases of the respiratory system: Z87.09

## 2014-11-19 HISTORY — DX: Frequency of micturition: R35.0

## 2014-11-19 HISTORY — DX: Other microscopic hematuria: R31.29

## 2014-11-19 HISTORY — DX: Diverticulosis of large intestine without perforation or abscess without bleeding: K57.30

## 2014-11-19 HISTORY — DX: Urgency of urination: R39.15

## 2014-11-19 HISTORY — DX: Calculus of ureter: N20.1

## 2014-11-19 HISTORY — DX: Personal history of other infectious and parasitic diseases: Z86.19

## 2014-11-19 HISTORY — DX: Other specified postprocedural states: Z98.890

## 2014-11-19 HISTORY — PX: CYSTOSCOPY WITH RETROGRADE PYELOGRAM, URETEROSCOPY AND STENT PLACEMENT: SHX5789

## 2014-11-19 HISTORY — DX: Personal history of malignant melanoma of skin: Z85.820

## 2014-11-19 SURGERY — CYSTOURETEROSCOPY, WITH RETROGRADE PYELOGRAM AND STENT INSERTION
Anesthesia: General | Site: Ureter | Laterality: Right

## 2014-11-19 MED ORDER — FENTANYL CITRATE 0.05 MG/ML IJ SOLN
INTRAMUSCULAR | Status: AC
  Filled 2014-11-19: qty 4

## 2014-11-19 MED ORDER — FENTANYL CITRATE 0.05 MG/ML IJ SOLN
25.0000 ug | INTRAMUSCULAR | Status: DC | PRN
  Filled 2014-11-19: qty 1

## 2014-11-19 MED ORDER — TRAMADOL-ACETAMINOPHEN 37.5-325 MG PO TABS: 1.0000 | ORAL_TABLET | Freq: Four times a day (QID) | ORAL | Status: DC | PRN

## 2014-11-19 MED ORDER — DEXAMETHASONE SODIUM PHOSPHATE 10 MG/ML IJ SOLN
INTRAMUSCULAR | Status: DC | PRN
  Administered 2014-11-19: 4 mg via INTRAVENOUS

## 2014-11-19 MED ORDER — KETOROLAC TROMETHAMINE 30 MG/ML IJ SOLN
INTRAMUSCULAR | Status: DC | PRN
  Administered 2014-11-19: 30 mg via INTRAVENOUS

## 2014-11-19 MED ORDER — CEFAZOLIN SODIUM-DEXTROSE 2-3 GM-% IV SOLR
2.0000 g | INTRAVENOUS | Status: AC
  Administered 2014-11-19: 1 g via INTRAVENOUS
  Filled 2014-11-19: qty 50

## 2014-11-19 MED ORDER — ONDANSETRON HCL 4 MG/2ML IJ SOLN
INTRAMUSCULAR | Status: DC | PRN
  Administered 2014-11-19: 4 mg via INTRAVENOUS

## 2014-11-19 MED ORDER — PROPOFOL 10 MG/ML IV BOLUS
INTRAVENOUS | Status: DC | PRN
Start: 2014-11-19 — End: 2014-11-19
  Administered 2014-11-19: 50 mg via INTRAVENOUS
  Administered 2014-11-19: 150 mg via INTRAVENOUS

## 2014-11-19 MED ORDER — MEPERIDINE HCL 25 MG/ML IJ SOLN
6.2500 mg | INTRAMUSCULAR | Status: DC | PRN
  Filled 2014-11-19: qty 1

## 2014-11-19 MED ORDER — LACTATED RINGERS IV SOLN
INTRAVENOUS | Status: DC
  Administered 2014-11-19: 12:00:00 via INTRAVENOUS
  Filled 2014-11-19: qty 1000

## 2014-11-19 MED ORDER — LACTATED RINGERS IV SOLN
INTRAVENOUS | Status: DC | PRN
  Administered 2014-11-19: 12:00:00 via INTRAVENOUS

## 2014-11-19 MED ORDER — PROMETHAZINE HCL 25 MG/ML IJ SOLN
6.2500 mg | INTRAMUSCULAR | Status: DC | PRN
  Filled 2014-11-19: qty 1

## 2014-11-19 MED ORDER — SODIUM CHLORIDE 0.9 % IR SOLN
Status: DC | PRN
  Administered 2014-11-19: 4000 mL

## 2014-11-19 MED ORDER — BELLADONNA ALKALOIDS-OPIUM 16.2-60 MG RE SUPP
RECTAL | Status: AC
  Filled 2014-11-19: qty 1

## 2014-11-19 MED ORDER — URIBEL 118 MG PO CAPS: 1.0000 | ORAL_CAPSULE | Freq: Two times a day (BID) | ORAL | Status: DC | PRN

## 2014-11-19 MED ORDER — TRAMADOL HCL 50 MG PO TABS
ORAL_TABLET | ORAL | Status: AC
  Filled 2014-11-19: qty 1

## 2014-11-19 MED ORDER — BELLADONNA ALKALOIDS-OPIUM 16.2-60 MG RE SUPP
RECTAL | Status: DC | PRN
  Administered 2014-11-19: 1 via RECTAL

## 2014-11-19 MED ORDER — LACTATED RINGERS IV SOLN
INTRAVENOUS | Status: DC
  Filled 2014-11-19: qty 1000

## 2014-11-19 MED ORDER — LIDOCAINE HCL (CARDIAC) 20 MG/ML IV SOLN
INTRAVENOUS | Status: DC | PRN
  Administered 2014-11-19: 80 mg via INTRAVENOUS

## 2014-11-19 MED ORDER — TRAMADOL HCL 50 MG PO TABS
50.0000 mg | ORAL_TABLET | Freq: Once | ORAL | Status: AC
  Administered 2014-11-19: 50 mg via ORAL
  Filled 2014-11-19: qty 1

## 2014-11-19 MED ORDER — IOHEXOL 350 MG/ML SOLN
INTRAVENOUS | Status: DC | PRN
  Administered 2014-11-19: 5 mL

## 2014-11-19 MED ORDER — ACETAMINOPHEN 10 MG/ML IV SOLN
INTRAVENOUS | Status: DC | PRN
  Administered 2014-11-19: 1000 mg via INTRAVENOUS

## 2014-11-19 MED ORDER — URELLE 81 MG PO TABS
ORAL_TABLET | ORAL | Status: AC
  Filled 2014-11-19: qty 1

## 2014-11-19 MED ORDER — FENTANYL CITRATE 0.05 MG/ML IJ SOLN
INTRAMUSCULAR | Status: DC | PRN
  Administered 2014-11-19: 25 ug via INTRAVENOUS
  Administered 2014-11-19 (×2): 12.5 ug via INTRAVENOUS
  Administered 2014-11-19 (×2): 25 ug via INTRAVENOUS

## 2014-11-19 SURGICAL SUPPLY — 37 items
ADAPTER CATH URET PLST 4-6FR (CATHETERS) IMPLANT
BAG DRAIN URO-CYSTO SKYTR STRL (DRAIN) ×3 IMPLANT
BASKET LASER NITINOL 1.9FR (BASKET) IMPLANT
BASKET STNLS GEMINI 4WIRE 3FR (BASKET) IMPLANT
BASKET ZERO TIP NITINOL 2.4FR (BASKET) ×3 IMPLANT
BOOTIES KNEE HIGH SLOAN (MISCELLANEOUS) ×3 IMPLANT
CANISTER SUCT LVC 12 LTR MEDI- (MISCELLANEOUS) ×3 IMPLANT
CATH CLEAR GEL 3F BACKSTOP (CATHETERS) IMPLANT
CATH INTERMIT  6FR 70CM (CATHETERS) IMPLANT
CATH URET 5FR 28IN CONE TIP (BALLOONS)
CATH URET 5FR 28IN OPEN ENDED (CATHETERS) IMPLANT
CATH URET 5FR 70CM CONE TIP (BALLOONS) IMPLANT
CATH URET DUAL LUMEN 6-10FR 50 (CATHETERS) IMPLANT
CLOTH BEACON ORANGE TIMEOUT ST (SAFETY) ×3 IMPLANT
DRAPE CAMERA CLOSED 9X96 (DRAPES) ×3 IMPLANT
ELECT REM PT RETURN 9FT ADLT (ELECTROSURGICAL)
ELECTRODE REM PT RTRN 9FT ADLT (ELECTROSURGICAL) IMPLANT
GLOVE BIO SURGEON STRL SZ7 (GLOVE) ×3 IMPLANT
GLOVE BIOGEL M 6.5 STRL (GLOVE) ×3 IMPLANT
GLOVE BIOGEL PI IND STRL 6.5 (GLOVE) ×2 IMPLANT
GLOVE BIOGEL PI INDICATOR 6.5 (GLOVE) ×4
GOWN PREVENTION PLUS LG XLONG (DISPOSABLE) ×3 IMPLANT
GOWN STRL REIN XL XLG (GOWN DISPOSABLE) IMPLANT
GOWN STRL REUS W/ TWL LRG LVL3 (GOWN DISPOSABLE) ×1 IMPLANT
GOWN STRL REUS W/ TWL XL LVL3 (GOWN DISPOSABLE) ×1 IMPLANT
GOWN STRL REUS W/TWL LRG LVL3 (GOWN DISPOSABLE) ×5 IMPLANT
GOWN STRL REUS W/TWL XL LVL3 (GOWN DISPOSABLE) ×5 IMPLANT
GUIDEWIRE 0.038 PTFE COATED (WIRE) IMPLANT
GUIDEWIRE ANG ZIPWIRE 038X150 (WIRE) IMPLANT
GUIDEWIRE STR DUAL SENSOR (WIRE) IMPLANT
IV NS IRRIG 3000ML ARTHROMATIC (IV SOLUTION) ×6 IMPLANT
KIT BALLIN UROMAX 15FX10 (LABEL) IMPLANT
KIT BALLN UROMAX 15FX4 (MISCELLANEOUS) IMPLANT
KIT BALLN UROMAX 26 75X4 (MISCELLANEOUS)
SET HIGH PRES BAL DIL (LABEL)
SHEATH ACCESS URETERAL 38CM (SHEATH) IMPLANT
STENT POLARIS 5FRX24X.038 (STENTS) ×3 IMPLANT

## 2014-11-19 NOTE — Anesthesia Preprocedure Evaluation (Signed)
Anesthesia Evaluation  Patient identified by MRN, date of birth, ID band Patient awake    Reviewed: Allergy & Precautions, H&P , NPO status , Patient's Chart, lab work & pertinent test results  Airway Mallampati: II  TM Distance: >3 FB Neck ROM: Full    Dental no notable dental hx.    Pulmonary former smoker,  breath sounds clear to auscultation  Pulmonary exam normal       Cardiovascular negative cardio ROS  Rhythm:Regular Rate:Normal     Neuro/Psych negative neurological ROS  negative psych ROS   GI/Hepatic negative GI ROS, Neg liver ROS,   Endo/Other  negative endocrine ROS  Renal/GU negative Renal ROS  negative genitourinary   Musculoskeletal negative musculoskeletal ROS (+)   Abdominal   Peds negative pediatric ROS (+)  Hematology negative hematology ROS (+)   Anesthesia Other Findings   Reproductive/Obstetrics negative OB ROS                             Anesthesia Physical Anesthesia Plan  ASA: I  Anesthesia Plan: General   Post-op Pain Management:    Induction: Intravenous  Airway Management Planned: LMA  Additional Equipment:   Intra-op Plan:   Post-operative Plan:   Informed Consent: I have reviewed the patients History and Physical, chart, labs and discussed the procedure including the risks, benefits and alternatives for the proposed anesthesia with the patient or authorized representative who has indicated his/her understanding and acceptance.   Dental advisory given  Plan Discussed with: CRNA  Anesthesia Plan Comments:         Anesthesia Quick Evaluation

## 2014-11-19 NOTE — Discharge Instructions (Addendum)
Kidney Stones Kidney stones (urolithiasis) are solid masses that form inside your kidneys. The intense pain is caused by the stone moving through the kidney, ureter, bladder, and urethra (urinary tract). When the stone moves, the ureter starts to spasm around the stone. The stone is usually passed in your pee (urine).  HOME CARE 1. Drink enough fluids to keep your pee clear or pale yellow. This helps to get the stone out. 2. Strain all pee through the provided strainer. Do not pee without peeing through the strainer, not even once. If you pee the stone out, catch it in the strainer. The stone may be as small as a grain of salt. Take this to your doctor. This will help your doctor figure out what you can do to try to prevent more kidney stones. 3. Only take medicine as told by your doctor. 4. Follow up with your doctor as told. 5. Get follow-up X-rays as told by your doctor. GET HELP IF: You have pain that gets worse even if you have been taking pain medicine. GET HELP RIGHT AWAY IF:   Your pain does not get better with medicine.  You have a fever or shaking chills.  Your pain increases and gets worse over 18 hours.  You have new belly (abdominal) pain.  You feel faint or pass out.  You are unable to pee. MAKE SURE YOU:   Understand these instructions.  Will watch your condition.  Will get help right away if you are not doing well or get worse. Document Released: 04/27/2008 Document Revised: 07/12/2013 Document Reviewed: 04/12/2013 [facility] Patient Information 2015 [place], [place]. This information is not intended to replace advice given to you by your health care provider. Make sure you discuss any questions you have with your health care provider.  Dietary Guidelines to Help Prevent Kidney Stones Your risk of kidney stones can be decreased by adjusting the foods you eat. The most important thing you can do is drink enough fluid. You should drink enough fluid to keep your urine  clear or pale yellow. The following guidelines provide specific information for the type of kidney stone you have had. GUIDELINES ACCORDING TO TYPE OF KIDNEY STONE Calcium Oxalate Kidney Stones 6. Reduce the amount of salt you eat. Foods that have a lot of salt cause your body to release excess calcium into your urine. The excess calcium can combine with a substance called oxalate to form kidney stones. 7. Reduce the amount of animal protein you eat if the amount you eat is excessive. Animal protein causes your body to release excess calcium into your urine. Ask your dietitian how much protein from animal sources you should be eating. 8. Avoid foods that are high in oxalates. If you take vitamins, they should have less than 500 mg of vitamin C. Your body turns vitamin C into oxalates. You do not need to avoid fruits and vegetables high in vitamin C. Calcium Phosphate Kidney Stones  Reduce the amount of salt you eat to help prevent the release of excess calcium into your urine.  Reduce the amount of animal protein you eat if the amount you eat is excessive. Animal protein causes your body to release excess calcium into your urine. Ask your dietitian how much protein from animal sources you should be eating.  Get enough calcium from food or take a calcium supplement (ask your dietitian for recommendations). Food sources of calcium that do not increase your risk of kidney stones include:  Broccoli.  Dairy products,  such as cheese and yogurt.  Pudding. Uric Acid Kidney Stones  Do not have more than 6 oz of animal protein per day. FOOD SOURCES Animal Protein Sources  Meat (all types).  Poultry.  Eggs.  Fish, seafood. Foods High in [employer] seasonings.  Soy sauce.  Teriyaki sauce.  Cured and processed meats.  Salted crackers and snack foods.  Fast food.  Canned soups and most canned foods. Foods High in Oxalates  Grains:  Amaranth.  Barley.  Grits.  Wheat  germ.  Bran.  Buckwheat flour.  All bran cereals.  Pretzels.  Whole wheat bread.  Vegetables:  Beans (wax).  Beets and beet greens.  Collard greens.  Eggplant.  [place].  Leeks.  Okra.  Parsley.  Rutabagas.  Spinach.  Swiss chard.  Tomato paste.  Fried potatoes.  Sweet potatoes.  Fruits:  Red currants.  Figs.  Kiwi.  Rhubarb.  Meat and Other Protein Sources:  Beans (dried).  Soy burgers and other soybean products.  Miso.  Nuts (peanuts, almonds, pecans, cashews, hazelnuts).  Nut butters.  Sesame seeds and tahini (paste made of sesame seeds).  Poppy seeds.  Beverages:  Chocolate drink mixes.  Soy milk.  Instant iced tea.  Juices made from high-oxalate fruits or vegetables.  Other:  Carob.  Chocolate.  Fruitcake.  Marmalades. Document Released: 03/06/2011 Document Revised: 11/14/2013 Document Reviewed: 10/06/2013 [facility] Patient Information 2015 [place], [place]. This information is not intended to replace advice given to you by your health care provider. Make sure you discuss any questions you have with your health care provider.    [facility] [phone] Post Ureteroscopy With or Without Stent Instructions  Definitions:  Ureter: The duct that transports urine from the kidney to the bladder. Stent:   A plastic hollow tube that is placed into the ureter, from the kidney to the                 bladder to prevent the ureter from swelling shut.  GENERAL INSTRUCTIONS:  Despite the fact that no skin incisions were used, the area around the ureter and bladder is raw and irritated. The stent is a foreign body which will further irritate the bladder wall. This irritation is manifested by increased frequency of urination, both day and night, and by an increase in the urge to urinate. In some, the urge to urinate is present almost always. Sometimes the urge is strong enough that you may not be able to stop  yourself from urinating. The only real cure is to remove the stent and then give time for the bladder wall to heal which can't be done until the danger of the ureter swelling shut has passed, which varies.  You may see some blood in your urine while the stent is in place and a few days afterwards. Do not be alarmed, even if the urine was clear for a while. Get off your feet and drink lots of fluids until clearing occurs. If you start to pass clots or don't improve, call us.  DIET: You may return to your normal diet immediately. Because of the raw surface of your bladder, alcohol, spicy foods, acid type foods and drinks with caffeine may cause irritation or frequency and should be used in moderation. To keep your urine flowing freely and to avoid constipation, drink plenty of fluids during the day ( 8-10 glasses ). Tip: Avoid cranberry juice because it is very acidic.  ACTIVITY: Your physical activity doesn't need to be restricted. However,  if you are very active, you may see some blood in your urine. We suggest that you reduce your activity under these circumstances until the bleeding has stopped.  BOWELS: It is important to keep your bowels regular during the postoperative period. Straining with bowel movements can cause bleeding. A bowel movement every other day is reasonable. Use a mild laxative if needed, such as Milk of Magnesia 2-3 tablespoons, or 2 Dulcolax tablets. Call if you continue to have problems. If you have been taking narcotics for pain, before, during or after your surgery, you may be constipated. Take a laxative if necessary.   MEDICATION: You should resume your pre-surgery medications unless told not to. In addition you will often be given an antibiotic to prevent infection. These should be taken as prescribed until the bottles are finished unless you are having an unusual reaction to one of the drugs.  PROBLEMS YOU SHOULD REPORT TO [place]:  Fevers over 100.5 Fahrenheit.  Heavy  bleeding, or clots ( See above notes about blood in urine ).  Inability to urinate.  Drug reactions ( hives, rash, nausea, vomiting, diarrhea ).  Severe burning or pain with urination that is not improving.  FOLLOW-UP: You will need a follow-up appointment to monitor your progress. Call for this appointment at the number listed above. Usually the first appointment will be about three to fourteen days after your surgery.     Post Anesthesia Home Care Instructions  Activity: Get plenty of rest for the remainder of the day. A responsible adult should stay with you for 24 hours following the procedure.  For the next 24 hours, DO NOT: -Drive a car -Paediatric nurse -Drink alcoholic beverages -Take any medication unless instructed by your physician -Make any legal decisions or sign important papers.  Meals: Start with liquid foods such as gelatin or soup. Progress to regular foods as tolerated. Avoid greasy, spicy, heavy foods. If nausea and/or vomiting occur, drink only clear liquids until the nausea and/or vomiting subsides. Call your physician if vomiting continues.  Special Instructions/Symptoms: Your throat may feel dry or sore from the anesthesia or the breathing tube placed in your throat during surgery. If this causes discomfort, gargle with warm salt water. The discomfort should disappear within 24 hours.

## 2014-11-19 NOTE — Interval H&P Note (Signed)
History and Physical Interval Note:  11/19/2014 11:38 AM  Darci Current  has presented today for surgery, with the diagnosis of LEFT URETERAL STONE  The various methods of treatment have been discussed with the patient and family. After consideration of risks, benefits and other options for treatment, the patient has consented to  Procedure(s): CYSTOSCOPY WITH LEFT  RETROGRADE PYELOGRAM, URETEROSCOPY  BASKET EXTRACTION OF STONE POSSIBLE [place] (Right) as a surgical intervention .  The patient's history has been reviewed, patient examined, no change in status, stable for surgery.  I have reviewed the patient's chart and labs.  Questions were answered to the patient's satisfaction.   Pt has not passed L ureteral stone. She continues to have s-p pressure, but no more L flank pain. No stone seen this weekend, She has vaginal voiding post vaginal sling surgery from many years ago. We will perform vaginal exam while she is under anesthesia. I do not think her voiding problems are related to her stone, however.   Carolan Clines I

## 2014-11-19 NOTE — H&P (Signed)
Reason For Visit Kidney stone   Active Problems Problems  1. Left ureteral stone (N20.1) 2. Microscopic hematuria (R31.2)  History of Present Illness     73 yo female, last seen 11/28/02, referred by Dr. Phineas Real for further evaluation of a kidney stone. She was seen in the ER on 11/10/14 for Lt flank pain. CT showed a 70mm Lt distal ureteral stone just above the UVJ with mild hydronephrosis. She was seen by Dr. Phineas Real on 11/01/14 for LLQ & RLQ abdominal pain that started 2 weeks prior. She had some intermittent gross hematuria. She continues to have suprapubic pain. No flank pain.   Past Medical History Problems  1. History of asthma (Z87.09) 2. History of malignant melanoma (V56.433)  Surgical History Problems  1. History of Bladder Surgery 2. History of Hysterectomy  Current Meds 1. Ambien TABS;  Therapy: (Recorded:23Dec2015) to Recorded 2. Calcium CAPS;  Therapy: (Recorded:23Dec2015) to Recorded 3. Iron TABS;  Therapy: (Recorded:23Dec2015) to Recorded 4. Multi Vitamin/Minerals TABS;  Therapy: (Recorded:23Dec2015) to Recorded  Allergies Medication  1. No Known Drug Allergies  Family History Problems  1. Family history of cerebral hemorrhage (Z82.0) : Father 2. Family history of stroke (Z82.3) : Mother   [place] 3. Family history of Heart trouble : Father  Social History Problems    Alcohol use (F10.99)   2 glasses wine a week.   Caffeine use (F15.90)   coffee once a month   Former smoker (Z87.891)   light smoker 3-4/day quit 1985   Two children   Widowed  Review of Systems Genitourinary, constitutional, skin, eye, otolaryngeal, hematologic/lymphatic, cardiovascular, pulmonary, endocrine, musculoskeletal, gastrointestinal, neurological and psychiatric system(s) were reviewed and pertinent findings if present are noted and are otherwise negative.  Genitourinary: hematuria.  Gastrointestinal: flank pain.    Vitals Vital Signs [Data Includes: Last  1 Day]  Recorded: 23Dec2015 11:32AM  Height: 5 ft  Weight: 121 lb  BMI Calculated: 23.63 BSA Calculated: 1.51 Blood Pressure: 163 / 80 Heart Rate: 82  Physical Exam Constitutional: Well nourished and well developed . No acute distress.  ENT:. The ears and nose are normal in appearance.  Neck: The appearance of the neck is normal.  Pulmonary: No respiratory distress.  Cardiovascular:. No peripheral edema.  Abdomen: The abdomen is flat and not distended. The abdomen is soft and nontender. No masses are palpated. Mild tenderness in the LLQ is present. No CVA tenderness. Bowel sounds are normal. No hernias are palpable. No hepatosplenomegaly noted.  Lymphatics: The femoral and inguinal nodes are not enlarged or tender.  Skin: Normal skin turgor, no visible rash and no visible skin lesions.  Neuro/Psych:. Mood and affect are appropriate.    Results/Data Urine [Data Includes: Last 1 Day]   [id number]  COLOR YELLOW   APPEARANCE CLEAR   SPECIFIC GRAVITY 1.030   pH 5.0   GLUCOSE NEG mg/dL  BILIRUBIN NEG   KETONE NEG mg/dL  BLOOD LARGE   PROTEIN NEG mg/dL  UROBILINOGEN 0.2 mg/dL  NITRITE NEG   LEUKOCYTE ESTERASE NEG   SQUAMOUS EPITHELIAL/HPF NONE SEEN   WBC NONE SEEN WBC/hpf  RBC 11-20 RBC/hpf  BACTERIA NONE SEEN   CRYSTALS NONE SEEN   CASTS NONE SEEN    KUB: no definite stone. +++ stool in colon. Bones: normal. Kidney outlines normal.   Assessment Assessed  1. Left ureteral stone (N20.1) 2. Microscopic hematuria (R31.2)  Clinically, pt has not passed the L lower ureteral stone. She continues to have LLQ and s-p discomfort. WE have reviewed  her CT together, as well as her KUB from today. She will have Toradol today, and strain urine, and note that she may need cysto, Left retrograde pyelogram,and basket extraction. She is offered surgery tomorrow, 12/24, but declines, as well as 12/25, but declines also. She will be placed on OR schedule for Monday at noon as outpatient, unless  she develops another episode of renal colic.   Plan Health Maintenance  1. UA With REFLEX; [Do Not Release]; Status:Resulted - Requires Verification;   Done:  [id number] 11:08AM Left ureteral stone  2. Administered: Ketorolac Tromethamine 60 MG/2ML Injection Solution 3. Follow-up Week x 1 Office  Follow-up  Status: Hold For - Appointment,Date of Service   Requested for: 23Dec2015 4. KUB; Status:Resulted - Requires Verification;   Done: [id number] 12:00AM  May take hydocodone.   Surgery scheduling for next week, unless she develops colid  Toradol today.   Strain urine.   Discussion/Summary cc: Donalynn Furlong, MD     Signatures Electronically signed by : Carolan Clines, M.D.; Nov 14 2014  5:22PM EST

## 2014-11-19 NOTE — Op Note (Signed)
Pre-operative diagnosis :   Impacted left lower ureteral calculus, 3 mm  Postoperative diagnosis:  Same  Operation:  Cystourethroscopy, left retrograde PolyMem interpretation, left lower ureteral dilation, ureteroscopy and basket extraction of left lower ureteral stone  Surgeon:  S. Gaynelle Arabian, MD  First assistant:  None  Anesthesia:  General  LMA  Preparation:  After appropriate preanesthesia, the patient was brought to the operating room and placed on the operating table in the dorsal supine position where general LMA anesthesia was induced. She was then replaced in the dorsal lithotomy position with pubis was prepped with Betadine solution and draped in usual fashion. The history was double checked. The arm band was double checked.  Review history:     73 yo female, last seen 11/28/02, referred by Dr. Phineas Real for further evaluation of a kidney stone. She was seen in the ER on 11/10/14 for Lt flank pain. CT showed a 49mm Lt distal ureteral stone just above the UVJ with mild hydronephrosis. She was seen by Dr. Phineas Real on 11/01/14 for LLQ & RLQ abdominal pain that started 2 weeks prior. She had some intermittent gross hematuria. She continues to have suprapubic pain. No flank pain.   Past Medical History  Statement of  Likelihood of Success: Excellent. TIME-OUT observed.:  Procedure:  Cystourethroscopy with a Cobb's, showing normal-appearing vagina on pelvic examination. The bladder appeared normal as well. The trigone was normal as well. Left retro-polygrams performed which showed stone at the ureterovesical junction. There was angulation of the left lower ureter, which did not allow the ureteral catheter to be passed to the level of the stone. However, a uro-0.038 guidewire was passed through the ureter, and around the stone into the renal pelvis and coiled. The 6.5 French ureteroscope was then passed into the ureteral orifice, and up to the level stone. However, the area of the ureter distal  to the stone was very stenotic, and would not allow passage of the ureteroscope or visualization of the stone. Therefore, the ureteroscope was removed, and the small short ureteral dilator was then passed to the level of the stone. It was removed, and the ureteroscope was again passed, this time with visualization of the stone. The stone appeared to be in multiple pieces, and was easily basket extracted. Repeat ureteroscopy showed no evidence of stone to the extent of the ureteroscope. Because of the extensive surgery within the ureter, was elected to pass a double-J stent, and therefore, a 5.4 [place] by 24 cm Polaris stent was passed, with string attached, and taped to the pubis. This was performed under fluoroscopic control. The patient tolerated  procedure well. She was awakened, taken to recovery room in good condition. She received IV Ancef, 1 g, as well as IV Toradol, and IV Tylenol.

## 2014-11-19 NOTE — Transfer of Care (Signed)
Immediate Anesthesia Transfer of Care Note  Patient: Kendra Cochran  Procedure(s) Performed: Procedure(s) (LRB): CYSTOSCOPY WITH LEFT  RETROGRADE PYELOGRAM, DILATION OF LEFT LOWER URETER, URETEROSCOPY, BASKET EXTRACTION OF STONE, JJ STENT PLACEMENT (Right)  Patient Location: PACU  Anesthesia Type: General  Level of Consciousness: awake, sedated, patient cooperative and responds to stimulation  Airway & Oxygen Therapy: Patient Spontanous Breathing and Patient connected to face mask oxygen  Post-op Assessment: Report given to PACU RN, Post -op Vital signs reviewed and stable and Patient moving all extremities  Post vital signs: Reviewed and stable  Complications: No apparent anesthesia complications

## 2014-11-19 NOTE — Anesthesia Postprocedure Evaluation (Signed)
  Anesthesia Post-op Note  Patient: Kendra Cochran  Procedure(s) Performed: Procedure(s) (LRB): CYSTOSCOPY WITH LEFT  RETROGRADE PYELOGRAM, DILATION OF LEFT LOWER URETER, URETEROSCOPY, BASKET EXTRACTION OF STONE, JJ STENT PLACEMENT (Right)  Patient Location: PACU  Anesthesia Type: General  Level of Consciousness: awake and alert   Airway and Oxygen Therapy: Patient Spontanous Breathing  Post-op Pain: mild  Post-op Assessment: Post-op Vital signs reviewed, Patient's Cardiovascular Status Stable, Respiratory Function Stable, Patent Airway and No signs of Nausea or vomiting  Last Vitals:  Filed Vitals:   11/19/14 1345  BP: 171/82  Pulse: 83  Temp:   Resp: 13    Post-op Vital Signs: stable   Complications: No apparent anesthesia complications

## 2014-11-20 ENCOUNTER — Encounter (HOSPITAL_BASED_OUTPATIENT_CLINIC_OR_DEPARTMENT_OTHER): Payer: Self-pay | Admitting: Urology

## 2014-12-03 ENCOUNTER — Encounter: Payer: Self-pay | Admitting: Gynecology

## 2015-01-02 ENCOUNTER — Ambulatory Visit: Payer: Medicare Other | Admitting: Gynecology

## 2015-01-02 ENCOUNTER — Other Ambulatory Visit: Payer: Medicare Other

## 2015-02-01 ENCOUNTER — Other Ambulatory Visit: Payer: Self-pay | Admitting: Gynecology

## 2015-02-01 ENCOUNTER — Ambulatory Visit (INDEPENDENT_AMBULATORY_CARE_PROVIDER_SITE_OTHER): Payer: Medicare Other

## 2015-02-01 ENCOUNTER — Encounter: Payer: Self-pay | Admitting: Gynecology

## 2015-02-01 ENCOUNTER — Ambulatory Visit (INDEPENDENT_AMBULATORY_CARE_PROVIDER_SITE_OTHER): Payer: Medicare Other | Admitting: Gynecology

## 2015-02-01 VITALS — BP 122/74

## 2015-02-01 DIAGNOSIS — Z803 Family history of malignant neoplasm of breast: Secondary | ICD-10-CM

## 2015-02-01 DIAGNOSIS — R102 Pelvic and perineal pain: Secondary | ICD-10-CM

## 2015-02-01 DIAGNOSIS — N83202 Unspecified ovarian cyst, left side: Secondary | ICD-10-CM

## 2015-02-01 DIAGNOSIS — N8320 Unspecified ovarian cysts: Secondary | ICD-10-CM

## 2015-02-01 NOTE — Progress Notes (Signed)
Kendra Cochran [birth year] [id number]        73 y.o.  [id number] presents for follow up ultrasound. She was initially seen in December with pelvic pain which ultimately turned out to be renal lithiasis and UTI. She had a pelvic ultrasound which showed a 12 x 7 x 11 mm slightly calcified left ovarian echo-free avascular cyst. She was asked to follow up in several months for repeat ultrasound for stability. She in the interval was evaluated by Dr. Gaynelle Arabian to include stent for her renal lithiasis. She reports feeling well now without pain.  Past medical history,surgical history, problem list, medications, allergies, family history and social history were all reviewed and documented in the EPIC chart.  Directed ROS with pertinent positives and negatives documented in the history of present illness/assessment and plan.  Ultrasound of vaginal cuff shows a normal-appearing cuff with atrophic right ovary. Persistence of the left echo-free thin-walled avascular cyst 12 x 8 x 8 mm. No free fluid.  Assessment/Plan:  74 y.o. 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 Suspect this is been present for years asymptomatic to the patient. I did recommend one more ultrasound in 6 months to make sure this remains stable and she agrees to schedule. She'll continue to follow up with urology if any recurrent pain or urinary issues.     Anastasio Auerbach MD, 11:57 AM 02/01/2015

## 2015-02-01 NOTE — Patient Instructions (Signed)
Follow up in the fall 2016 for breast and pelvic exam as well as repeat ultrasound to follow up on the left ovarian cyst.

## 2015-05-03 ENCOUNTER — Other Ambulatory Visit: Payer: Self-pay | Admitting: Internal Medicine

## 2015-05-03 DIAGNOSIS — M79621 Pain in right upper arm: Secondary | ICD-10-CM

## 2015-05-06 ENCOUNTER — Other Ambulatory Visit: Payer: Self-pay

## 2015-06-07 ENCOUNTER — Other Ambulatory Visit: Payer: Self-pay | Admitting: Gynecology

## 2015-06-07 DIAGNOSIS — N949 Unspecified condition associated with female genital organs and menstrual cycle: Secondary | ICD-10-CM

## 2015-08-07 ENCOUNTER — Other Ambulatory Visit: Payer: Self-pay | Admitting: Gynecology

## 2015-08-07 ENCOUNTER — Ambulatory Visit (INDEPENDENT_AMBULATORY_CARE_PROVIDER_SITE_OTHER): Payer: Medicare Other

## 2015-08-07 ENCOUNTER — Ambulatory Visit (INDEPENDENT_AMBULATORY_CARE_PROVIDER_SITE_OTHER): Payer: Medicare Other | Admitting: Gynecology

## 2015-08-07 ENCOUNTER — Encounter: Payer: Self-pay | Admitting: Gynecology

## 2015-08-07 VITALS — BP 122/76

## 2015-08-07 DIAGNOSIS — N8320 Unspecified ovarian cysts: Secondary | ICD-10-CM | POA: Diagnosis not present

## 2015-08-07 DIAGNOSIS — N949 Unspecified condition associated with female genital organs and menstrual cycle: Secondary | ICD-10-CM

## 2015-08-07 DIAGNOSIS — G4700 Insomnia, unspecified: Secondary | ICD-10-CM | POA: Diagnosis not present

## 2015-08-07 DIAGNOSIS — N83202 Unspecified ovarian cyst, left side: Secondary | ICD-10-CM

## 2015-08-07 MED ORDER — ZOLPIDEM TARTRATE 10 MG PO TABS: 10.0000 mg | ORAL_TABLET | Freq: Every evening | ORAL | Status: DC | PRN

## 2015-08-07 MED ORDER — DIAZEPAM 5 MG PO TABS: 5.0000 mg | ORAL_TABLET | Freq: Four times a day (QID) | ORAL | Status: DC | PRN

## 2015-08-07 NOTE — Patient Instructions (Signed)
Follow up in a year for annual exam. Sooner if any issues

## 2015-08-07 NOTE — Progress Notes (Signed)
Kendra Cochran [birth year] [id number]        74 y.o.  [id number] presents for follow up ultrasound.  History of being seen in December 2015 for pelvic pain and an ultrasound showed a 12 x 7 x 11 mm calcified echo-free avascular left ovarian cyst. Repeat March 2016 showed persistence/stability of this echo-free avascular cyst at 12 x 8 x 8 mm. Patient was recommended to follow up in 6 months for ultrasound. She is doing well without complaints. She had a pelvic exam in December was asking about her annual. I discussed with her go ahead and do a breast exam today which will cover her breast and pelvic and she has a full general exam by her primary physician Dr. Felipa Eth  Past medical history,surgical history, problem list, medications, allergies, family history and social history were all reviewed and documented in the EPIC chart.  Directed ROS with pertinent positives and negatives documented in the history of present illness/assessment and plan.  Exam: [name] Filed Vitals:   08/07/15 1435  BP: 122/76   General appearance:  Normal Both breasts examined lying and sitting without masses retractions discharge adenopathy.  Ultrasound shows right ovary atrophic normal in appearance. Left ovary with persistence of the echo-free avascular cyst 11 x 7 x 10 mm with linear calcifications. Cul-de-sac negative.  Assessment/Plan:  74 y.o. [id number] with persistent small left ovarian cyst most likely present for years recently discovered. She is asymptomatic. Unchanged over the past 9 months. Options for continued observation versus monitoring and no further studies discussed. Patient's comfortable with no further studies at this point. I did a breast exam which was normal. Recommend follow up in one year for annual exam/pelvic exam. I did refill her medications at her request to include Ambien 10 mg which she breaks into one thirds #30 with 2 refills that she uses intermittently to help with sleep. Valium 5  mg #30 with 1 refill that she uses when she travels.    Kendra Auerbach MD, 3:00 PM 08/07/2015

## 2015-08-09 ENCOUNTER — Telehealth: Payer: Self-pay | Admitting: *Deleted

## 2015-08-09 NOTE — Telephone Encounter (Signed)
Pt called requesting urine results from ultrasound appointment on 08/07/15. I left on voicemail no urine ran because not order placed

## 2015-12-12 ENCOUNTER — Encounter: Payer: Self-pay | Admitting: Gynecology

## 2015-12-13 ENCOUNTER — Encounter: Payer: Self-pay | Admitting: Gynecology

## 2016-02-14 ENCOUNTER — Telehealth: Payer: Self-pay | Admitting: *Deleted

## 2016-02-14 NOTE — Telephone Encounter (Signed)
I would recommend that she start with her primary physician for exam and triage. There are many reasons for lower abdominal pain to include GI, urinary and GYN. She's had a hysterectomy but she does have her ovaries. They may suggest starting with something different like a CT scan which would look at her ovaries regardless and save her the ultrasound.

## 2016-02-14 NOTE — Telephone Encounter (Signed)
Pt called c/o right side lower quad  discomfort x 3 weeks dull achy, comes and goes, pt said her abdomen is extended, normal bowel movements, no problems with urination.  States she notices the discomfort sometimes after eating as well. She would like to have ultrasound scheduled if possible. Please advise

## 2016-02-14 NOTE — Telephone Encounter (Signed)
Pt declined OV today would like ultrasound scheduled with OV.

## 2016-02-14 NOTE — Telephone Encounter (Signed)
Left a detailed message on pt voicemail with the below note, per dpr access

## 2016-02-18 ENCOUNTER — Other Ambulatory Visit: Payer: Self-pay | Admitting: Geriatric Medicine

## 2016-02-18 DIAGNOSIS — R1031 Right lower quadrant pain: Secondary | ICD-10-CM

## 2016-02-21 ENCOUNTER — Ambulatory Visit
Admission: RE | Admit: 2016-02-21 | Discharge: 2016-02-21 | Disposition: A | Discharge status: Home / Self Care | Payer: Medicare Other | Source: Ambulatory Visit | Attending: Geriatric Medicine | Admitting: Geriatric Medicine

## 2016-02-21 DIAGNOSIS — R1031 Right lower quadrant pain: Secondary | ICD-10-CM

## 2016-02-21 MED ORDER — IOPAMIDOL (ISOVUE-300) INJECTION 61%
100.0000 mL | Freq: Once | INTRAVENOUS | Status: AC | PRN
  Administered 2016-02-21: 100 mL via INTRAVENOUS

## 2016-06-24 ENCOUNTER — Encounter: Payer: Self-pay | Admitting: Gynecology

## 2016-08-27 ENCOUNTER — Encounter: Payer: Medicare Other | Admitting: Gynecology

## 2016-09-30 ENCOUNTER — Encounter: Payer: Medicare Other | Admitting: Gynecology

## 2016-11-18 ENCOUNTER — Ambulatory Visit (INDEPENDENT_AMBULATORY_CARE_PROVIDER_SITE_OTHER): Payer: Medicare Other | Admitting: Gynecology

## 2016-11-18 ENCOUNTER — Encounter: Payer: Self-pay | Admitting: Gynecology

## 2016-11-18 VITALS — BP 120/70 | Ht 59.0 in | Wt 114.0 lb

## 2016-11-18 DIAGNOSIS — M8580 Other specified disorders of bone density and structure, unspecified site: Secondary | ICD-10-CM | POA: Diagnosis not present

## 2016-11-18 DIAGNOSIS — N952 Postmenopausal atrophic vaginitis: Secondary | ICD-10-CM

## 2016-11-18 DIAGNOSIS — N83202 Unspecified ovarian cyst, left side: Secondary | ICD-10-CM | POA: Diagnosis not present

## 2016-11-18 DIAGNOSIS — Z01411 Encounter for gynecological examination (general) (routine) with abnormal findings: Secondary | ICD-10-CM

## 2016-11-18 NOTE — Patient Instructions (Signed)

## 2016-11-18 NOTE — Progress Notes (Signed)
    LIBERTI SUITTER Apr 18, 1941 [id number]        75 y.o.  [id number] for annual exam.    Past medical history,surgical history, problem list, medications, allergies, family history and social history were all reviewed and documented as reviewed in the EPIC chart.  ROS:  Performed with pertinent positives and negatives included in the history, assessment and plan.   Additional significant findings :  None   Exam: Journalist, newspaper Vitals:   11/18/16 1208  BP: 120/70  Weight: 114 lb (51.7 kg)  Height: 4\' 11"  (1.499 m)   Body mass index is 23.03 kg/m.  General appearance:  Normal affect, orientation and appearance. Skin: Grossly normal HEENT: Without gross lesions.  No cervical or supraclavicular adenopathy. Thyroid normal.  Lungs:  Clear without wheezing, rales or rhonchi Cardiac: RR, without RMG Abdominal:  Soft, nontender, without masses, guarding, rebound, organomegaly or hernia Breasts:  Examined lying and sitting without masses, retractions, discharge or axillary adenopathy. Pelvic:  Ext, BUS, Vagina with atrophic changes  Adnexa without masses or tenderness    Anus and perineum normal   Rectovaginal normal sphincter tone without palpated masses or tenderness.    Assessment/Plan:  75 y.o. [id number] female for annual exam.   1. Postmenopausal/atrophic genital changes. Status post TVH for dysmenorrhea. Doing well without significant hot flushes, night sweats or vaginal dryness. 2. Osteopenia. Last DEXA in chart 2013 with T score -2.3. Had been on Fosamax for 10 years and off now for approximately 6 years. Reports having had a recent DEXA through [facility] with follow up with Dr. Felipa Eth who prescribed Fosamax for her. Had a lot of GI upset and she stopped this. Recommended she call and follow up with Dr. Felipa Eth for continued management of her osteopenia as he has initiated treatment with her. She agrees to call let him know that she stop the Fosamax and to see what the next step  would be. 3. History of left ovarian cyst. Ultrasound 2015 with 12 x 7 x 11 mm calcified echo-free avascular left ovarian cyst. Follow up ultrasound March 2016 avascular echo-free 12 x 8 x 8 mm cyst. Last ultrasound 07/2015 showed 11 x 7 x 10 mm avascular calcified cyst. Reviewed with patient and she is asymptomatic without pelvic pain and is comfortable with expectant management with no further studies unless develops symptoms given the stability of the cyst and small benign appearance. 4. Pap smear 2012. No Pap smear done today. No history of abnormal Pap smears previously. We both agree to stop screening per current screening guidelines based on age and hysterectomy history. 5. Colonoscopy 2010. Repeat at their recommended interval. 6. Mammography coming due in January and she knows to follow up for this. SBE monthly reviewed. 7. Health maintenance. No routine lab work done as patient does this at her primary physician's office. Follow up 1 year, sooner as needed.   Anastasio Auerbach MD, 12:33 PM 11/18/2016

## 2016-12-12 ENCOUNTER — Other Ambulatory Visit: Payer: Self-pay | Admitting: Gynecology

## 2016-12-12 DIAGNOSIS — G4700 Insomnia, unspecified: Secondary | ICD-10-CM

## 2016-12-14 NOTE — Telephone Encounter (Signed)
Called into pharmacy

## 2017-08-18 ENCOUNTER — Other Ambulatory Visit: Payer: Self-pay | Admitting: Geriatric Medicine

## 2017-08-18 DIAGNOSIS — R072 Precordial pain: Secondary | ICD-10-CM

## 2017-08-26 ENCOUNTER — Ambulatory Visit: Payer: Medicare Other

## 2017-11-23 HISTORY — PX: LUMBAR DISC SURGERY: SHX700

## 2017-12-29 ENCOUNTER — Telehealth (HOSPITAL_COMMUNITY): Payer: Self-pay | Admitting: *Deleted

## 2017-12-29 ENCOUNTER — Other Ambulatory Visit: Payer: Self-pay | Admitting: Geriatric Medicine

## 2017-12-29 DIAGNOSIS — R072 Precordial pain: Secondary | ICD-10-CM

## 2017-12-29 NOTE — Telephone Encounter (Signed)
Patient given detailed instructions per Myocardial Perfusion Study Information Sheet for the test on 12/30/17 at 7:30. Patient notified to arrive 15 minutes early and that it is imperative to arrive on time for appointment to keep from having the test rescheduled.  If you need to cancel or reschedule your appointment, please call the office within 24 hours of your appointment. . Patient verbalized understanding.Kendra Cochran

## 2017-12-30 ENCOUNTER — Ambulatory Visit (HOSPITAL_COMMUNITY): Payer: Medicare Other | Attending: Cardiology

## 2017-12-30 DIAGNOSIS — R072 Precordial pain: Secondary | ICD-10-CM | POA: Diagnosis not present

## 2017-12-30 DIAGNOSIS — R0789 Other chest pain: Secondary | ICD-10-CM | POA: Diagnosis present

## 2017-12-30 DIAGNOSIS — Z8249 Family history of ischemic heart disease and other diseases of the circulatory system: Secondary | ICD-10-CM | POA: Diagnosis not present

## 2017-12-30 DIAGNOSIS — I10 Essential (primary) hypertension: Secondary | ICD-10-CM | POA: Insufficient documentation

## 2017-12-30 LAB — MYOCARDIAL PERFUSION IMAGING
CHL CUP NUCLEAR SSS: 12
CHL CUP RESTING HR STRESS: 83 {beats}/min
CSEPEDS: 0 s
CSEPEW: 7 METS
Exercise duration (min): 5 min
LV dias vol: 34 mL (ref 46–106)
LV sys vol: 5 mL
MPHR: 142 {beats}/min
NUC STRESS TID: 0.65
Peak HR: 144 {beats}/min
Percent HR: 101 %
RATE: 0.26
SDS: 5
SRS: 7

## 2017-12-30 MED ORDER — REGADENOSON 0.4 MG/5ML IV SOLN
0.4000 mg | Freq: Once | INTRAVENOUS | Status: AC
  Administered 2017-12-30: 0.4 mg via INTRAVENOUS

## 2017-12-30 MED ORDER — TECHNETIUM TC 99M TETROFOSMIN IV KIT
32.9000 | PACK | Freq: Once | INTRAVENOUS | Status: AC | PRN
  Administered 2017-12-30: 32.9 via INTRAVENOUS
  Filled 2017-12-30: qty 33

## 2017-12-30 MED ORDER — TECHNETIUM TC 99M TETROFOSMIN IV KIT
10.1000 | PACK | Freq: Once | INTRAVENOUS | Status: AC | PRN
  Administered 2017-12-30: 10.1 via INTRAVENOUS
  Filled 2017-12-30: qty 11

## 2018-02-15 ENCOUNTER — Other Ambulatory Visit: Payer: Self-pay | Admitting: Gynecology

## 2018-02-15 DIAGNOSIS — G4700 Insomnia, unspecified: Secondary | ICD-10-CM

## 2018-02-15 NOTE — Telephone Encounter (Signed)
At age 77 I am a little reluctant with the Ambien and Valium use for risks of fall and the effects with aging.  I think that she would be better served to obtain the occasions through her primary physician who oversees all of her health care rather than her gynecologist.

## 2018-02-15 NOTE — Telephone Encounter (Signed)
Last annual exam was 11/08/16.  Next CE with you is scheduled for 06/02/18.

## 2018-02-15 NOTE — Telephone Encounter (Signed)
Called patient and per DPR access note on file I left detailed message in voice mail at home ans machine explaining to patient that Dr. Loetta Rough will not be refilling and wants her to check with PCP due to reasons stated in Dr. Dorette Grate note. Left my direct phone number in case she has questions.

## 2018-02-17 ENCOUNTER — Other Ambulatory Visit: Payer: Self-pay | Admitting: Gynecology

## 2018-02-17 ENCOUNTER — Encounter: Payer: Medicare Other | Admitting: Gynecology

## 2018-02-17 DIAGNOSIS — G4700 Insomnia, unspecified: Secondary | ICD-10-CM

## 2018-06-02 ENCOUNTER — Encounter: Payer: Self-pay | Admitting: Gynecology

## 2018-06-02 ENCOUNTER — Ambulatory Visit: Payer: Medicare Other | Admitting: Gynecology

## 2018-06-02 VITALS — BP 124/76 | Ht 60.0 in | Wt 116.0 lb

## 2018-06-02 DIAGNOSIS — Z01419 Encounter for gynecological examination (general) (routine) without abnormal findings: Secondary | ICD-10-CM

## 2018-06-02 DIAGNOSIS — N952 Postmenopausal atrophic vaginitis: Secondary | ICD-10-CM

## 2018-06-02 DIAGNOSIS — Z8742 Personal history of other diseases of the female genital tract: Secondary | ICD-10-CM

## 2018-06-02 DIAGNOSIS — M818 Other osteoporosis without current pathological fracture: Secondary | ICD-10-CM

## 2018-06-02 NOTE — Progress Notes (Signed)
    Kendra Cochran 22-Feb-1941 [id number]        77 y.o.  [id number] for annual gynecologic exam.  Without gynecologic complaints  Past medical history,surgical history, problem list, medications, allergies, family history and social history were all reviewed and documented as reviewed in the EPIC chart.  ROS:  Performed with pertinent positives and negatives included in the history, assessment and plan.   Additional significant findings : None   Exam: Caryn Bee assistant Vitals:   06/02/18 1158  BP: 124/76  Weight: 116 lb (52.6 kg)  Height: 5' (1.524 m)   Body mass index is 22.65 kg/m.  General appearance:  Normal affect, orientation and appearance. Skin: Grossly normal HEENT: Without gross lesions.  No cervical or supraclavicular adenopathy. Thyroid normal.  Lungs:  Clear without wheezing, rales or rhonchi Cardiac: RR, without RMG Abdominal:  Soft, nontender, without masses, guarding, rebound, organomegaly or hernia Breasts:  Examined lying and sitting without masses, retractions, discharge or axillary adenopathy. Pelvic:  Ext, BUS, Vagina: With atrophic changes  Adnexa: Without masses or tenderness  Anus and perineum: Normal   Rectovaginal: Normal sphincter tone without palpated masses or tenderness.    Assessment/Plan:  77 y.o. [id number] female for annual gynecologic exam.   1. Postmenopausal/atrophic genital changes.  Status post TVH for dysmenorrhea in the past.  No significant menopausal symptoms. 2. History of left ovarian cyst.  Small and stable on serial sonography.  As per her 11/18/2016 note we both agree to stop following.  She is without symptoms.  Exam is normal. 3. Osteoporosis.  DEXA reported 2017 through Dr. Felipa Eth.  Had been on Fosamax for 10 years, had drug-free holiday and then reinitiated after the 2017 study.  Did not tolerate the Fosamax and has been started on Prolia.  Will continue to follow-up with Dr. Felipa Eth in reference to her bone  health. 4. Pap smear 2012.  No Pap smear done today.  No history of abnormal Pap smears.  We both agree to stop screening per current screening guidelines. 5. Colonoscopy 2010.  Repeat at their recommended interval. 6. Mammography 11/2017.  Continue with annual mammography when due.  Breast exam normal today. 7. Health maintenance.  No routine lab work done as patient does this elsewhere.  Follow-up 1 year, sooner as needed.   Anastasio Auerbach MD, 12:59 PM 06/02/2018

## 2018-06-02 NOTE — Patient Instructions (Signed)
Follow-up in 1 year, sooner as needed. 

## 2018-06-03 NOTE — Addendum Note (Signed)
Addended by: Alen Blew on: 06/03/2018 09:02 AM   Modules accepted: Level of Service

## 2018-07-23 ENCOUNTER — Other Ambulatory Visit: Payer: Self-pay

## 2018-07-23 ENCOUNTER — Encounter (HOSPITAL_COMMUNITY): Payer: Self-pay | Admitting: *Deleted

## 2018-07-23 ENCOUNTER — Emergency Department (HOSPITAL_COMMUNITY)
Admission: EM | Admit: 2018-07-23 | Discharge: 2018-07-23 | Disposition: A | Discharge status: Home / Self Care | Payer: Medicare Other | Attending: Emergency Medicine | Admitting: Emergency Medicine

## 2018-07-23 ENCOUNTER — Emergency Department (HOSPITAL_COMMUNITY): Payer: Medicare Other

## 2018-07-23 DIAGNOSIS — Z87891 Personal history of nicotine dependence: Secondary | ICD-10-CM | POA: Diagnosis not present

## 2018-07-23 DIAGNOSIS — S8001XA Contusion of right knee, initial encounter: Secondary | ICD-10-CM | POA: Diagnosis not present

## 2018-07-23 DIAGNOSIS — Y939 Activity, unspecified: Secondary | ICD-10-CM | POA: Diagnosis not present

## 2018-07-23 DIAGNOSIS — S8991XA Unspecified injury of right lower leg, initial encounter: Secondary | ICD-10-CM | POA: Diagnosis present

## 2018-07-23 DIAGNOSIS — W19XXXA Unspecified fall, initial encounter: Secondary | ICD-10-CM | POA: Insufficient documentation

## 2018-07-23 DIAGNOSIS — Z79899 Other long term (current) drug therapy: Secondary | ICD-10-CM | POA: Diagnosis not present

## 2018-07-23 DIAGNOSIS — I10 Essential (primary) hypertension: Secondary | ICD-10-CM | POA: Diagnosis not present

## 2018-07-23 DIAGNOSIS — Y999 Unspecified external cause status: Secondary | ICD-10-CM | POA: Diagnosis not present

## 2018-07-23 DIAGNOSIS — M5417 Radiculopathy, lumbosacral region: Secondary | ICD-10-CM | POA: Insufficient documentation

## 2018-07-23 DIAGNOSIS — Y929 Unspecified place or not applicable: Secondary | ICD-10-CM | POA: Diagnosis not present

## 2018-07-23 MED ORDER — HYDROMORPHONE HCL 1 MG/ML IJ SOLN
0.5000 mg | Freq: Once | INTRAMUSCULAR | Status: AC
  Administered 2018-07-23: 0.5 mg via INTRAMUSCULAR
  Filled 2018-07-23: qty 1

## 2018-07-23 MED ORDER — METHOCARBAMOL 500 MG PO TABS
500.0000 mg | ORAL_TABLET | Freq: Once | ORAL | Status: AC
  Administered 2018-07-23: 500 mg via ORAL
  Filled 2018-07-23: qty 1

## 2018-07-23 MED ORDER — OXYCODONE-ACETAMINOPHEN 5-325 MG PO TABS: 1.0000 | ORAL_TABLET | ORAL | 0 refills | Status: DC | PRN

## 2018-07-23 MED ORDER — OXYCODONE-ACETAMINOPHEN 5-325 MG PO TABS
1.0000 | ORAL_TABLET | Freq: Once | ORAL | Status: AC
  Administered 2018-07-23: 1 via ORAL
  Filled 2018-07-23: qty 1

## 2018-07-23 MED ORDER — METHOCARBAMOL 500 MG PO TABS: 500.0000 mg | ORAL_TABLET | Freq: Four times a day (QID) | ORAL | 0 refills | Status: DC | PRN

## 2018-07-23 MED ORDER — KETOROLAC TROMETHAMINE 60 MG/2ML IM SOLN
30.0000 mg | Freq: Once | INTRAMUSCULAR | Status: AC
Start: 2018-07-23 — End: 2018-07-23
  Administered 2018-07-23: 30 mg via INTRAMUSCULAR
  Filled 2018-07-23: qty 2

## 2018-07-23 NOTE — ED Provider Notes (Signed)
[place] DEPT Provider Note   CSN: [id number] Arrival date & time: 07/23/18  0549     History   Chief Complaint Chief Complaint  Patient presents with  . Back Pain    HPI Kendra Cochran is a 77 y.o. female.  HPI Kendra Cochran is a 77 y.o. 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  Patient is followed by Bernita Raisin.  She states that she was diagnosed with a herniated disc at L5.  She has been receiving epidural injections every few months.  She states in the last 10 days her pain has gotten significantly worse.  It is radiating from the lower back into her right buttock and posterior right leg as well as groin and into the ankle.  She had an MRI done at [facility] in [place] which she states showed a herniated L4 disc.  She was started on prednisone and tramadol.  She states that she is finished with a prednisone pack.  She states the tramadol is not helping her pain so the called her and hydrocodone.  She states that she has to walk around with a walker due to pain.  She states that she is unable to lay down flat or straighten her right leg due to pain.  She is unable to sit comfortably especially on her right buttock.  She denies any urinary incontinence or trouble controlling her bowels.  No fever.  She states that her leg did give out 2 days ago when she fell onto her right knee and that it is bruised and swollen and is painful.  She states that she could not sleep last night however she did not take her hydrocodone because she took Ambien and she was afraid of taking both at the same time.  This morning when her pain was worse, she took hydrocodone and when she had no relief she decided to come here for further evaluation.  Past Medical History:  Diagnosis Date  . Diverticulosis of colon   . Elevated cholesterol   . Frequency of urination   . History of  chronic bronchitis   . History of melanoma excision   . History of shingles    2005--  no residual pain  . Hypertension   . Left ureteral calculus   . Microhematuria   . Osteopenia 09/2012   T score -2.3, 3% decline from prior DEXA statistically significant AP spine, hips stable  . Urgency of urination     Patient Active Problem List   Diagnosis Date Noted  . Melanoma [facility])     Past Surgical History:  Procedure Laterality Date  . BLADDER SUSPENSION  09-11-2002   SPARC pubovaginal sling  . BREAST BIOPSY  2001   benign  . CATARACT EXTRACTION W/ INTRAOCULAR LENS  IMPLANT, BILATERAL  2006  . COLONOSCOPY  03-21-2009  . COSMETIC SURGERY     Eye lid lift  . CYSTOSCOPY WITH RETROGRADE PYELOGRAM, URETEROSCOPY AND STENT PLACEMENT Right 11/19/2014   Procedure: CYSTOSCOPY WITH LEFT  RETROGRADE PYELOGRAM, DILATION OF LEFT LOWER URETER, URETEROSCOPY, BASKET EXTRACTION OF STONE, JJ STENT PLACEMENT;  Surgeon: Ailene Rud, MD;  Location: [facility];  Service: Urology;  Laterality: Right;  . EXCISION OF MELANOMA  1976   RUQ of breast  . VAGINAL HYSTERECTOMY  1983     OB History    Gravida  3   Para  2   Term  2   Preterm      AB  1   Living  2     SAB      TAB      Ectopic      Multiple      Live Births               Home Medications    Prior to Admission medications   Medication Sig Start Date End Date Taking? Authorizing Provider  acetaminophen (TYLENOL) 500 MG tablet Take 1,000 mg by mouth every 6 (six) hours as needed for mild pain or headache.    [provider]  atorvastatin (LIPITOR) 10 MG tablet Take 10 mg by mouth daily.    [provider]  calcium-vitamin D (OSCAL WITH D) 500-200 MG-UNIT per tablet Take 1 tablet by mouth 2 (two) times daily.    [provider]  denosumab (PROLIA) 60 MG/ML SOSY injection Inject 60 mg into the skin every 6 (six) months.    [provider]  diazepam (VALIUM) 5 MG  tablet TAKE 1 TABLET EVERY 6 HOURS IF NEEDED FOR ANXIETY. 12/14/16   Fontaine, Belinda Block, MD  ibuprofen (ADVIL,MOTRIN) 800 MG tablet Take 1 tablet (800 mg total) by mouth every 8 (eight) hours as needed. Patient taking differently: Take 800 mg by mouth every 8 (eight) hours as needed for moderate pain.  11/06/14   Fontaine, Belinda Block, MD  lisinopril (PRINIVIL,ZESTRIL) 5 MG tablet Take 5 mg by mouth daily.    [provider]  Multiple Vitamin (MULTIVITAMIN) capsule Take 1 capsule by mouth daily.    [provider]  zolpidem (AMBIEN) 10 MG tablet TAKE 1 TABLET AT BEDTIME AS NEEDED. 12/14/16   Fontaine, Belinda Block, MD    Family History Family History  Problem Relation Age of Onset  . Heart disease Father   . Breast cancer Paternal Aunt        Age 72's    Social History Social History   Tobacco Use  . Smoking status: Former Smoker    Packs/day: 0.25    Years: 25.00    Pack years: 6.25    Types: Cigarettes    Last attempt to quit: 11/14/2014    Years since quitting: 3.6  . Smokeless tobacco: Never Used  Substance Use Topics  . Alcohol use: Yes    Alcohol/week: 5.0 standard drinks    Types: 2 Glasses of wine, 3 Standard drinks or equivalent per week  . Drug use: No     Allergies   Patient has no known allergies.   Review of Systems Review of Systems  Constitutional: Negative for chills and fever.  Respiratory: Negative for cough, chest tightness and shortness of breath.   Cardiovascular: Negative for chest pain, palpitations and leg swelling.  Gastrointestinal: Negative for abdominal pain, diarrhea, nausea and vomiting.  Genitourinary: Negative for dysuria and flank pain.  Musculoskeletal: Positive for back pain, joint swelling and myalgias. Negative for arthralgias, neck pain and neck stiffness.  Skin: Negative for rash.  Neurological: Positive for weakness and numbness. Negative for dizziness and headaches.  All other systems reviewed and are  negative.    Physical Exam Updated Vital Signs BP (!) 143/86 (BP Location: Left Arm)   Pulse 83   Temp 98.3 F (36.8 C) (Oral)   Resp 16   SpO2 100%   Physical Exam  Constitutional: She appears well-developed and well-nourished. No distress.  HENT:  Head: Normocephalic.  Eyes: Conjunctivae  are normal.  Neck: Neck supple.  Cardiovascular: Normal rate, regular rhythm and normal heart sounds.  Pulmonary/Chest: Effort normal and breath sounds normal. No respiratory distress. She has no wheezes. She has no rales.  Abdominal: Soft. Bowel sounds are normal. She exhibits no distension. There is no tenderness. There is no rebound.  Musculoskeletal: She exhibits no edema.  Tenderness to palpation in the left lower back diffusely, with most tenderness in the right SI joint and right buttock.  Pain with any range of motion of the right hip especially with straightening leg.  There is a large contusion to the anterior right knee.  Joint appears to be stable with negative anterior posterior drawer sign.  No laxity with medial lateral stress.  Full range of motion of the knee.  Normal right ankle with no obvious swelling or deformity.  Neurological: She is alert.  Skin: Skin is warm and dry.  Psychiatric: She has a normal mood and affect. Her behavior is normal.  Nursing note and vitals reviewed.    ED Treatments / Results  Labs (all labs ordered are listed, but only abnormal results are displayed) Labs Reviewed - No data to display  EKG None  Radiology Dg Knee Complete 4 Views Right  Result Date: 07/23/2018 CLINICAL DATA:  Right knee injury due to a fall 07/22/2018. Initial encounter. EXAM: RIGHT KNEE - COMPLETE 4+ VIEW COMPARISON:  None. FINDINGS: No evidence of fracture, dislocation, or joint effusion. No evidence of arthropathy or other focal bone abnormality. Soft tissues are unremarkable. IMPRESSION: Negative exam. Electronically Signed   By: Inge Rise M.D.   On: 07/23/2018  07:18    Procedures Procedures (including critical care time)  Medications Ordered in ED Medications  HYDROmorphone (DILAUDID) injection 0.5 mg (0.5 mg Intramuscular Given 07/23/18 0708)  methocarbamol (ROBAXIN) tablet 500 mg (500 mg Oral Given 07/23/18 0708)  oxyCODONE-acetaminophen (PERCOCET/ROXICET) 5-325 MG per tablet 1 tablet (1 tablet Oral Given 07/23/18 0824)  ketorolac (TORADOL) injection 30 mg (30 mg Intramuscular Given 07/23/18 0825)     Initial Impression / Assessment and Plan / ED Course  I have reviewed the triage vital signs and the nursing notes.  Pertinent labs & imaging results that were available during my care of the patient were reviewed by me and considered in my medical decision making (see chart for details).     Patient with worsening back pain, recently diagnosed with a herniated L for disc by MRI.  Finished prednisone pack and currently takes hydrocodone which she did not take last night and states that she is unable to control this pain at home.  She appears to be very uncomfortable.  I will image her right knee, x-rays ordered.  I will give her pain medications and try to get her pain better controlled here.   10:07 AM Patient's knee x-rays negative.  She is feeling better.  She is able to ambulate in department.  She is requesting stronger medications at home.  We will give her Percocet and Robaxin for pain and spasms at home.  I instructed her to follow-up closely with her doctor on Tuesday since Monday is a holiday.  Patient agreed.  Return precautions discussed.  No concern for cauda equina at this time.  She appears to be much more comfortable.  Vitals:   07/23/18 0730 07/23/18 0800 07/23/18 0830 07/23/18 0934  BP: (!) 141/67 (!) 158/87 (!) 151/66 135/74  Pulse: 72 75 71 70  Resp:   16 16  Temp:  98.3 F (36.8 C)  TempSrc:    Oral  SpO2: 98% 100% 100% 98%     Final Clinical Impressions(s) / ED Diagnoses   Final diagnoses:  Lumbosacral  radiculopathy  Contusion of right knee, initial encounter    ED Discharge Orders         Ordered    methocarbamol (ROBAXIN) 500 MG tablet  Every 6 hours PRN     07/23/18 1005    oxyCODONE-acetaminophen (PERCOCET) 5-325 MG tablet  Every 4 hours PRN     07/23/18 1005           Jeannett Senior, PA-C 07/23/18 1007    Ripley Fraise, MD 07/23/18 2305

## 2018-07-23 NOTE — ED Triage Notes (Signed)
Pt reports lower back pain. Followed by ortho, states she had MRI this week and she has confirmed bulging disk at 4 & 5. She is using prescribed pain meds without relief. Pt says that on Friday she also experienced a fall, has pain in the right knee and right side.

## 2018-07-23 NOTE — ED Provider Notes (Signed)
Patient seen/examined in the Emergency Department in conjunction with Midlevel Provider [place] Patient reports low back pain with known bulging disc.  She also reports recent fall with pain in the right knee. Exam : Awake alert, no acute distress, keeping right hip and right knee flexed. Plan: Plan to obtain imaging of right knee.  She had recent MRI by her orthopedist for her low back. Plan to treat pain and reassess.   Ripley Fraise, MD 07/23/18 [phone]

## 2018-07-23 NOTE — Discharge Instructions (Addendum)
Take Percocet as prescribed as needed for pain.  Take Robaxin for muscle spasms.  You can take your nighttime sleeping medicine with this as well.  Follow-up with your doctor as soon as possible for further evaluation treatment.  Return if worsening

## 2018-07-23 NOTE — ED Notes (Signed)
Patient ambulated to bathroom with 1 person assist.  

## 2018-08-04 ENCOUNTER — Other Ambulatory Visit: Payer: Self-pay

## 2018-08-04 ENCOUNTER — Emergency Department (HOSPITAL_COMMUNITY): Payer: Medicare Other

## 2018-08-04 ENCOUNTER — Emergency Department (HOSPITAL_COMMUNITY)
Admission: EM | Admit: 2018-08-04 | Discharge: 2018-08-04 | Disposition: A | Discharge status: Home / Self Care | Payer: Medicare Other | Attending: Emergency Medicine | Admitting: Emergency Medicine

## 2018-08-04 ENCOUNTER — Encounter (HOSPITAL_COMMUNITY): Payer: Self-pay | Admitting: Emergency Medicine

## 2018-08-04 DIAGNOSIS — Y929 Unspecified place or not applicable: Secondary | ICD-10-CM | POA: Diagnosis not present

## 2018-08-04 DIAGNOSIS — Z79899 Other long term (current) drug therapy: Secondary | ICD-10-CM | POA: Insufficient documentation

## 2018-08-04 DIAGNOSIS — S93601A Unspecified sprain of right foot, initial encounter: Secondary | ICD-10-CM | POA: Diagnosis not present

## 2018-08-04 DIAGNOSIS — I10 Essential (primary) hypertension: Secondary | ICD-10-CM | POA: Diagnosis not present

## 2018-08-04 DIAGNOSIS — S99921A Unspecified injury of right foot, initial encounter: Secondary | ICD-10-CM | POA: Diagnosis present

## 2018-08-04 DIAGNOSIS — Y939 Activity, unspecified: Secondary | ICD-10-CM | POA: Diagnosis not present

## 2018-08-04 DIAGNOSIS — M5431 Sciatica, right side: Secondary | ICD-10-CM | POA: Diagnosis not present

## 2018-08-04 DIAGNOSIS — X509XXA Other and unspecified overexertion or strenuous movements or postures, initial encounter: Secondary | ICD-10-CM | POA: Diagnosis not present

## 2018-08-04 DIAGNOSIS — Z87891 Personal history of nicotine dependence: Secondary | ICD-10-CM | POA: Insufficient documentation

## 2018-08-04 DIAGNOSIS — Y999 Unspecified external cause status: Secondary | ICD-10-CM | POA: Insufficient documentation

## 2018-08-04 DIAGNOSIS — D649 Anemia, unspecified: Secondary | ICD-10-CM | POA: Diagnosis not present

## 2018-08-04 LAB — CBC WITH DIFFERENTIAL/PLATELET
Basophils Absolute: 0 10*3/uL (ref 0.0–0.1)
Basophils Relative: 0 %
EOS ABS: 0.1 10*3/uL (ref 0.0–0.7)
EOS PCT: 2 %
HCT: 23.7 % — ABNORMAL LOW (ref 36.0–46.0)
Hemoglobin: 8 g/dL — ABNORMAL LOW (ref 12.0–15.0)
LYMPHS ABS: 1.2 10*3/uL (ref 0.7–4.0)
Lymphocytes Relative: 18 %
MCH: 32.3 pg (ref 26.0–34.0)
MCHC: 33.8 g/dL (ref 30.0–36.0)
MCV: 95.6 fL (ref 78.0–100.0)
MONO ABS: 0.4 10*3/uL (ref 0.1–1.0)
MONOS PCT: 5 %
Neutro Abs: 4.8 10*3/uL (ref 1.7–7.7)
Neutrophils Relative %: 75 %
PLATELETS: 126 10*3/uL — AB (ref 150–400)
RBC: 2.48 MIL/uL — ABNORMAL LOW (ref 3.87–5.11)
RDW: 14.3 % (ref 11.5–15.5)
WBC: 6.5 10*3/uL (ref 4.0–10.5)

## 2018-08-04 LAB — BASIC METABOLIC PANEL
Anion gap: 8 (ref 5–15)
BUN: 30 mg/dL — AB (ref 8–23)
CALCIUM: 9 mg/dL (ref 8.9–10.3)
CO2: 26 mmol/L (ref 22–32)
CREATININE: 0.94 mg/dL (ref 0.44–1.00)
Chloride: 107 mmol/L (ref 98–111)
GFR calc Af Amer: >60 mL/min (ref >60)
GFR, EST NON AFRICAN AMERICAN: 56 mL/min — AB (ref >60)
GLUCOSE: 100 mg/dL — AB (ref 70–99)
Potassium: 4.3 mmol/L (ref 3.5–5.1)
SODIUM: 141 mmol/L (ref 135–145)

## 2018-08-04 LAB — CK: CK TOTAL: 124 U/L (ref 38–234)

## 2018-08-04 MED ORDER — SODIUM CHLORIDE 0.9 % IV BOLUS
1000.0000 mL | Freq: Once | INTRAVENOUS | Status: AC
  Administered 2018-08-04: 1000 mL via INTRAVENOUS

## 2018-08-04 MED ORDER — METHYLPREDNISOLONE SODIUM SUCC 125 MG IJ SOLR
125.0000 mg | Freq: Once | INTRAMUSCULAR | Status: AC
  Administered 2018-08-04: 125 mg via INTRAVENOUS
  Filled 2018-08-04: qty 2

## 2018-08-04 MED ORDER — METHYLPREDNISOLONE 4 MG PO TBPK: ORAL_TABLET | ORAL | 0 refills | Status: AC

## 2018-08-04 NOTE — ED Triage Notes (Signed)
Patient c/o pain in rt foot pain and limited movement after falling on marble floor yesterday afternoon. Pt reports weakness in rt leg which caused her rt leg to buckle and fall.  Patient had back surgery Tuesday.

## 2018-08-04 NOTE — ED Provider Notes (Signed)
[facility] DEPT Provider Note   CSN: [id number] Arrival date & time: 08/04/18  4098     History   Chief Complaint Chief Complaint  Patient presents with  . Fall  . Foot Pain    HPI CALE DECAROLIS is a 77 y.o. female hx of L4 radiculopathy s/p microdiscectomy 2 days ago by Dr. Annette Stable, here presenting with fall, back pain, right foot drop.  Patient states that she had a microdiscectomy 2 days ago and went home the same day.  She states that before the surgery, she had weakness in the right leg as well as sciatica symptoms.  She states that right after the surgery the symptoms resolved.  However yesterday she noticed that her right leg gave out and she landed on her right foot.  She states that subsequently, her right foot is weak and she has trouble lifting up the foot.  She denies any numbness.  She denies fall onto her back or back injury.  She states that the dressing is still in place and there is no drainage from it.  She denies any fevers or chills or leg swelling.  Patient took her Vicodin for pain and pain has improved since then.  The history is provided by the patient.    Past Medical History:  Diagnosis Date  . Diverticulosis of colon   . Elevated cholesterol   . Frequency of urination   . History of chronic bronchitis   . History of melanoma excision   . History of shingles    2005--  no residual pain  . Hypertension   . Left ureteral calculus   . Microhematuria   . Osteopenia 09/2012   T score -2.3, 3% decline from prior DEXA statistically significant AP spine, hips stable  . Urgency of urination     Patient Active Problem List   Diagnosis Date Noted  . Melanoma [facility])     Past Surgical History:  Procedure Laterality Date  . BLADDER SUSPENSION  09-11-2002   SPARC pubovaginal sling  . BREAST BIOPSY  2001   benign  . CATARACT EXTRACTION W/ INTRAOCULAR LENS  IMPLANT, BILATERAL  2006  . COLONOSCOPY  03-21-2009  . COSMETIC  SURGERY     Eye lid lift  . CYSTOSCOPY WITH RETROGRADE PYELOGRAM, URETEROSCOPY AND STENT PLACEMENT Right 11/19/2014   Procedure: CYSTOSCOPY WITH LEFT  RETROGRADE PYELOGRAM, DILATION OF LEFT LOWER URETER, URETEROSCOPY, BASKET EXTRACTION OF STONE, JJ STENT PLACEMENT;  Surgeon: Ailene Rud, MD;  Location: [facility];  Service: Urology;  Laterality: Right;  . EXCISION OF MELANOMA  1976   RUQ of breast  . VAGINAL HYSTERECTOMY  1983     OB History    Gravida  3   Para  2   Term  2   Preterm      AB  1   Living  2     SAB      TAB      Ectopic      Multiple      Live Births               Home Medications    Prior to Admission medications   Medication Sig Start Date End Date Taking? Authorizing Provider  acetaminophen (TYLENOL) 500 MG tablet Take 1,000 mg by mouth every 6 (six) hours as needed for mild pain or headache.    [provider]  amLODipine (NORVASC) 5 MG tablet Take 5 mg by mouth daily.  [provider]  atorvastatin (LIPITOR) 10 MG tablet Take 10 mg by mouth daily.    [provider]  BIOTIN PO Take 1 tablet by mouth daily.    [provider]  calcium-vitamin D (OSCAL WITH D) 500-200 MG-UNIT per tablet Take 1 tablet by mouth 2 (two) times daily.    [provider]  denosumab (PROLIA) 60 MG/ML SOSY injection Inject 60 mg into the skin every 6 (six) months.    [provider]  diazepam (VALIUM) 5 MG tablet TAKE 1 TABLET EVERY 6 HOURS IF NEEDED FOR ANXIETY. Patient not taking: Reported on 07/23/2018 12/14/16   Fontaine, Belinda Block, MD  HYDROcodone-acetaminophen (NORCO/VICODIN) 5-325 MG tablet Take 1 tablet by mouth every 6 (six) hours as needed for moderate pain.    [provider]  ibuprofen (ADVIL,MOTRIN) 800 MG tablet Take 1 tablet (800 mg total) by mouth every 8 (eight) hours as needed. Patient not taking: Reported on 07/23/2018 11/06/14   Fontaine, Belinda Block, MD  lactase  (LACTAID) 3000 units tablet Take 9,000 Units by mouth 3 (three) times daily with meals.    [provider]  lisinopril (PRINIVIL,ZESTRIL) 5 MG tablet Take 5 mg by mouth daily.    [provider]  methocarbamol (ROBAXIN) 500 MG tablet Take 1 tablet (500 mg total) by mouth every 6 (six) hours as needed for muscle spasms. 07/23/18   Kirichenko, Lahoma Rocker, PA-C  Multiple Vitamin (MULTIVITAMIN) capsule Take 1 capsule by mouth daily.    [provider]  oxyCODONE-acetaminophen (PERCOCET) 5-325 MG tablet Take 1 tablet by mouth every 4 (four) hours as needed for severe pain. 07/23/18   Kirichenko, Tatyana, PA-C  traMADol (ULTRAM) 50 MG tablet Take 50 mg by mouth every 8 (eight) hours as needed for moderate pain.    [provider]  zolpidem (AMBIEN) 10 MG tablet TAKE 1 TABLET AT BEDTIME AS NEEDED. Patient taking differently: Take 10 mg by mouth at bedtime as needed for sleep.  12/14/16   Fontaine, Belinda Block, MD    Family History Family History  Problem Relation Age of Onset  . Heart disease Father   . Breast cancer Paternal Aunt        Age 89's    Social History Social History   Tobacco Use  . Smoking status: Former Smoker    Packs/day: 0.25    Years: 25.00    Pack years: 6.25    Types: Cigarettes    Last attempt to quit: 11/14/2014    Years since quitting: 3.7  . Smokeless tobacco: Never Used  Substance Use Topics  . Alcohol use: Yes    Alcohol/week: 5.0 standard drinks    Types: 2 Glasses of wine, 3 Standard drinks or equivalent per week  . Drug use: No     Allergies   Patient has no known allergies.   Review of Systems Review of Systems  Musculoskeletal:       R foot and leg pain and weakness   All other systems reviewed and are negative.    Physical Exam Updated Vital Signs BP (!) 155/72 (BP Location: Left Arm)   Pulse 79   Temp 98.6 F (37 C) (Oral)   Resp 18   Ht 5' (1.524 m)   Wt 52.2 kg   SpO2 93%   BMI 22.46 kg/m    Physical Exam  Constitutional: She is oriented to person, place, and time.  Uncomfortable   HENT:  Head: Normocephalic.  Mouth/Throat: Oropharynx is clear and  moist.  Eyes: Pupils are equal, round, and reactive to light. Conjunctivae and EOM are normal.  Neck: Normal range of motion. Neck supple.  Cardiovascular: Normal rate, regular rhythm and normal heart sounds.  Pulmonary/Chest: Effort normal and breath sounds normal. No stridor. No respiratory distress. She has no wheezes.  Abdominal: Soft. Bowel sounds are normal. She exhibits no distension. There is no tenderness. There is no guarding.  Musculoskeletal:  Lower lumbar dressing in place (patient doesn't want me to remove it). No obvious drainage. There is diffuse lower lumbar tenderness but no obvious deformity. Nl ROM bilateral hips. Bruising R proximal tibia but able to range the knee. No bruising on the ankle or foot. 2+ pulses.   Neurological: She is alert and oriented to person, place, and time.  Trouble with dorsiflexion on R foot, able to plantarflex. Nl patellar reflexes on the right. No saddle anesthesia. L leg neurovascular exam unremarkable   Skin: Skin is warm. Capillary refill takes less than 2 seconds.  Psychiatric: She has a normal mood and affect.  Nursing note and vitals reviewed.    ED Treatments / Results  Labs (all labs ordered are listed, but only abnormal results are displayed) Labs Reviewed  CBC WITH DIFFERENTIAL/PLATELET - Abnormal; Notable for the following components:      Result Value   RBC 2.48 (*)    Hemoglobin 8.0 (*)    HCT 23.7 (*)    Platelets 126 (*)    All other components within normal limits  BASIC METABOLIC PANEL - Abnormal; Notable for the following components:   Glucose, Bld 100 (*)    BUN 30 (*)    GFR calc non Af Amer 56 (*)    All other components within normal limits  CK    EKG None  Radiology Dg Tibia/fibula Right  Result Date: 08/04/2018 CLINICAL DATA:  Right foot  pain with limited movement after a fall, initial encounter. EXAM: RIGHT TIBIA AND FIBULA - 2 VIEW COMPARISON:  None. FINDINGS: No acute osseous abnormality. IMPRESSION: No acute osseous abnormality. Electronically Signed   By: Lorin Picket M.D.   On: 08/04/2018 09:45   Dg Foot Complete Right  Result Date: 08/04/2018 CLINICAL DATA:  Right foot pain, limited movement, fall, initial encounter. EXAM: RIGHT FOOT COMPLETE - 3+ VIEW COMPARISON:  None. FINDINGS: No acute osseous or joint abnormality. Tiny osseous density adjacent to the second distal interphalangeal joint is sclerotic and therefore indicative of an old lesion. IMPRESSION: No acute osseous abnormality. Electronically Signed   By: Lorin Picket M.D.   On: 08/04/2018 09:52    Procedures Procedures (including critical care time)  Medications Ordered in ED Medications  sodium chloride 0.9 % bolus 1,000 mL (1,000 mLs Intravenous New Bag/Given 08/04/18 0933)  methylPREDNISolone sodium succinate (SOLU-MEDROL) 125 mg/2 mL injection 125 mg (125 mg Intravenous Given 08/04/18 0933)     Initial Impression / Assessment and Plan / ED Course  I have reviewed the triage vital signs and the nursing notes.  Pertinent labs & imaging results that were available during my care of the patient were reviewed by me and considered in my medical decision making (see chart for details).     GENELL THEDE is a 77 y.o. female here with R leg weakness after fall. She had R leg weakness before the surgery that resolved after surgery. However, now she has foot drop that is new after she fell. No back injury. Will get labs, consult Dr. Annette Stable for imaging recommendations.  10:36 AM xrays unremarkable. Hg 8.0 but last Hg in the system was in 2015. Denies any blood in stool or bleeding. I talked to Dr. Marchelle Folks PA initially and then with Dr. Annette Stable. He recommend course of steroids and continue pain meds, flexeril. She also has a walker and I encourage her to use it.  Will dc home with medrol dose pack.    Final Clinical Impressions(s) / ED Diagnoses   Final diagnoses:  None    ED Discharge Orders    None       Drenda Freeze, MD 08/04/18 1037

## 2018-08-04 NOTE — Discharge Instructions (Addendum)
You have rebound sciatica symptoms and likely sprained your right foot.   Please use your walker for the last 4-5 days.   Take medrol dose pack as directed.   You can take flexeril for muscle spasms and vicodin for severe pain as prescribed by Dr. Annette Stable.   See Dr. Annette Stable as scheduled. If you are in severe pain or have worse weakness or numbness, please call his office for an earlier appointment   Your hemoglobin today is 8. Repeat with your doctor in the next few weeks. Observe for any signs of bleeding or black stools.   Return to ER if you have worse leg numbness or weakness, foot pain, fever.

## 2019-01-12 ENCOUNTER — Encounter: Payer: Self-pay | Admitting: Gynecology

## 2019-04-26 ENCOUNTER — Encounter: Payer: Self-pay | Admitting: General Surgery

## 2019-04-28 ENCOUNTER — Ambulatory Visit: Payer: Medicare Other | Admitting: Internal Medicine

## 2019-04-28 ENCOUNTER — Encounter: Payer: Self-pay | Admitting: Internal Medicine

## 2019-04-28 ENCOUNTER — Other Ambulatory Visit: Payer: Self-pay

## 2019-04-28 ENCOUNTER — Ambulatory Visit (INDEPENDENT_AMBULATORY_CARE_PROVIDER_SITE_OTHER): Payer: Medicare Other | Admitting: Internal Medicine

## 2019-04-28 VITALS — Ht 60.0 in | Wt 115.0 lb

## 2019-04-28 DIAGNOSIS — Z1211 Encounter for screening for malignant neoplasm of colon: Secondary | ICD-10-CM | POA: Diagnosis not present

## 2019-04-28 DIAGNOSIS — Z86010 Personal history of colonic polyps: Secondary | ICD-10-CM | POA: Diagnosis not present

## 2019-04-28 NOTE — Progress Notes (Signed)
HISTORY OF PRESENT ILLNESS:  Kendra Cochran is a 78 y.o. female, acquaintance of Dr. Velora Heckler, who schedules this appointment today regarding the need for screening colonoscopy.  She has had multiple colonoscopies over the years.  Diminutive hyperplastic but no adenomatous polyps.  Last examination April 2010.  Examination was normal except for mild sigmoid diverticulosis and a diminutive hyperplastic rectal polyp.  No family history of colon cancer.  GI review of systems is negative including change in bowel habits, abdominal pain, or bleeding.  She appropriately inquires as to the necessity of colonoscopy at age 46 with her history.  REVIEW OF SYSTEMS:  All non-GI ROS negative unless otherwise stated in the HPI  Past Medical History:  Diagnosis Date  . Diverticulosis of colon   . Elevated cholesterol   . Frequency of urination   . History of chronic bronchitis   . History of melanoma excision   . History of shingles    2005--  no residual pain  . Hypertension   . Left ureteral calculus   . Microhematuria   . Osteopenia 09/2012   T score -2.3, 3% decline from prior DEXA statistically significant AP spine, hips stable  . Urgency of urination     Past Surgical History:  Procedure Laterality Date  . BLADDER SUSPENSION  09-11-2002   SPARC pubovaginal sling  . BREAST BIOPSY  2001   benign  . CATARACT EXTRACTION W/ INTRAOCULAR LENS  IMPLANT, BILATERAL  2006  . COLONOSCOPY  03-21-2009  . COSMETIC SURGERY     Eye lid lift  . CYSTOSCOPY WITH RETROGRADE PYELOGRAM, URETEROSCOPY AND STENT PLACEMENT Right 11/19/2014   Procedure: CYSTOSCOPY WITH LEFT  RETROGRADE PYELOGRAM, DILATION OF LEFT LOWER URETER, URETEROSCOPY, BASKET EXTRACTION OF STONE, JJ STENT PLACEMENT;  Surgeon: Ailene Rud, MD;  Location: [facility];  Service: Urology;  Laterality: Right;  . EXCISION OF MELANOMA  1976   RUQ of breast  . LUMBAR [place] SURGERY  2019  . Crystal  reports that she quit smoking about 34 years ago. Her smoking use included cigarettes. She has a 6.25 pack-year smoking history. She has never used smokeless tobacco. She reports current alcohol use of about 5.0 standard drinks of alcohol per week. She reports that she does not use drugs.  family history includes Breast cancer in her paternal aunt; Heart disease in her father.  No Known Allergies     PHYSICAL EXAMINATION: No physical exam with telehealth visit  ASSESSMENT:  1.  Multiple colonoscopies over the years without adenomatous polyps 2.  Negative GI review of systems 3.  Age 65  PLAN:  1.  Patient has aged out of routine screening colonoscopy based on her personal history and age.  We reviewed the most current guidelines.  She is pleased.  Having said that, she understands to contact the office should she have any relevant GI problems develop or questions. This telehealth visit was between patient who scheduled the visit during the coronavirus pandemic and myself.  She was at home and I was in my office.

## 2019-04-28 NOTE — Patient Instructions (Signed)
1.  Patient has aged out of routine screening colonoscopy based on her personal history and age.  We reviewed the most current guidelines.  She is pleased.  Having said that, she understands to contact the office should she have any relevant GI problems develop or questions.

## 2019-06-29 ENCOUNTER — Encounter: Payer: Medicare Other | Admitting: Gynecology

## 2019-08-31 ENCOUNTER — Encounter: Payer: Self-pay | Admitting: Gynecology

## 2019-09-13 ENCOUNTER — Other Ambulatory Visit: Payer: Self-pay

## 2019-09-14 ENCOUNTER — Encounter: Payer: Self-pay | Admitting: Gynecology

## 2019-09-14 ENCOUNTER — Ambulatory Visit (INDEPENDENT_AMBULATORY_CARE_PROVIDER_SITE_OTHER): Payer: Medicare Other | Admitting: Gynecology

## 2019-09-14 VITALS — BP 120/78 | Ht 60.0 in

## 2019-09-14 DIAGNOSIS — N83202 Unspecified ovarian cyst, left side: Secondary | ICD-10-CM | POA: Diagnosis not present

## 2019-09-14 DIAGNOSIS — M810 Age-related osteoporosis without current pathological fracture: Secondary | ICD-10-CM | POA: Diagnosis not present

## 2019-09-14 DIAGNOSIS — Z01419 Encounter for gynecological examination (general) (routine) without abnormal findings: Secondary | ICD-10-CM | POA: Diagnosis not present

## 2019-09-14 DIAGNOSIS — N952 Postmenopausal atrophic vaginitis: Secondary | ICD-10-CM | POA: Diagnosis not present

## 2019-09-14 NOTE — Progress Notes (Signed)
    Kendra Cochran Jan 05, 1941 [id number]        78 y.o.  [id number] for annual gynecologic exam.  Without gynecologic complaints.  Past medical history,surgical history, problem list, medications, allergies, family history and social history were all reviewed and documented as reviewed in the EPIC chart.  ROS:  Performed with pertinent positives and negatives included in the history, assessment and plan.   Additional significant findings : None   Exam: Wandra Scot assistant Vitals:   09/14/19 1123  BP: 120/78  Height: 5' (1.524 m)   Body mass index is 22.46 kg/m.  General appearance:  Normal affect, orientation and appearance. Skin: Grossly normal HEENT: Without gross lesions.  No cervical or supraclavicular adenopathy. Thyroid normal.  Lungs:  Clear without wheezing, rales or rhonchi Cardiac: RR, without RMG Abdominal:  Soft, nontender, without masses, guarding, rebound, organomegaly or hernia Breasts:  Examined lying and sitting without masses, retractions, discharge or axillary adenopathy. Pelvic:  Ext, BUS, Vagina: Normal with atrophic changes  Adnexa: Without masses or tenderness    Anus and perineum: Normal   Rectovaginal: Normal sphincter tone without palpated masses or tenderness.    Assessment/Plan:  78 y.o. [id number] female for annual gynecologic exam.   1. Postmenopausal.  Status post TVH for dysmenorrhea in the past.  No significant menopausal symptoms. 2. History of small 1 cm left ovarian cyst.  We had followed with serial ultrasounds and it remained unchanged and we have stopped screening as of 2017.  Bimanual exam today is normal.  Patient without symptoms.  We will continue to follow expectantly. 3. Osteoporosis.  DEXA reported this year.  Followed by Dr. Felipa Eth.  I do not have a copy of her DEXA but she continues on Prolia and will continue to follow-up with him for bone health management. 4. Pap smear 2012.  No Pap smear done today.  We both agree to stop  screening per current screening guidelines.  No history of abnormal Pap smears previously. 5. Colonoscopy 2010.  Reports that they recommended stop colonoscopies due to her age. 6. Mammography 12/2018.  Continue with annual mammography when due.  Breast exam normal today. 7. Health maintenance.  No routine lab work done as patient does this elsewhere.  Follow-up 1 year, sooner as needed.   Anastasio Auerbach MD, 12:10 PM 09/14/2019

## 2019-09-14 NOTE — Patient Instructions (Signed)
Follow-up in 1 year for annual exam, sooner if any issues. 

## 2019-12-02 ENCOUNTER — Ambulatory Visit: Payer: Medicare Other | Attending: Internal Medicine

## 2019-12-02 DIAGNOSIS — Z23 Encounter for immunization: Secondary | ICD-10-CM | POA: Insufficient documentation

## 2019-12-02 NOTE — Progress Notes (Signed)
   Covid-19 Vaccination Clinic  Name:  Kendra Cochran    MRN: [id number] DOB: [birth year]  12/02/2019  Ms. Kendra Cochran was observed post Covid-19 immunization for 15 minutes without incidence. She was provided with Vaccine Information Sheet and instruction to access the V-Safe system.   Ms. Kendra Cochran was instructed to call 911 with any severe reactions post vaccine: Marland Kitchen Difficulty breathing  . Swelling of your face and throat  . A fast heartbeat  . A bad rash all over your body  . Dizziness and weakness    Immunizations Administered    Name Date Dose VIS Date Route   Pfizer COVID-19 Vaccine 12/02/2019  1:32 PM 0.3 mL 11/03/2019 Intramuscular   Manufacturer: [place]   Lot: [id number]   [place]: [id number]

## 2019-12-04 ENCOUNTER — Ambulatory Visit: Payer: Medicare Other

## 2019-12-23 ENCOUNTER — Ambulatory Visit: Payer: Medicare Other

## 2019-12-23 ENCOUNTER — Ambulatory Visit: Payer: Medicare Other | Attending: Internal Medicine

## 2019-12-23 DIAGNOSIS — Z23 Encounter for immunization: Secondary | ICD-10-CM | POA: Insufficient documentation

## 2019-12-23 NOTE — Progress Notes (Signed)
   Covid-19 Vaccination Clinic  Name:  Kendra Cochran    MRN: [id number] DOB: Jul 31, 1941  12/23/2019  [name] was observed post Covid-19 immunization for 15 minutes without incidence. She was provided with Vaccine Information Sheet and instruction to access the V-Safe system.   [name] was instructed to call 911 with any severe reactions post vaccine: Marland Kitchen Difficulty breathing  . Swelling of your face and throat  . A fast heartbeat  . A bad rash all over your body  . Dizziness and weakness    Immunizations Administered    Name Date Dose VIS Date Route   Pfizer COVID-19 Vaccine 12/23/2019 10:47 AM 0.3 mL 11/03/2019 Intramuscular   Manufacturer: [place]   Lot: [id number]   [place]: [id number]

## 2020-05-21 ENCOUNTER — Other Ambulatory Visit: Payer: Self-pay | Admitting: Internal Medicine

## 2020-05-21 ENCOUNTER — Ambulatory Visit
Admission: RE | Admit: 2020-05-21 | Discharge: 2020-05-21 | Disposition: A | Discharge status: Home / Self Care | Payer: Medicare Other | Source: Ambulatory Visit | Attending: Internal Medicine | Admitting: Internal Medicine

## 2020-05-21 DIAGNOSIS — R52 Pain, unspecified: Secondary | ICD-10-CM

## 2020-06-03 ENCOUNTER — Other Ambulatory Visit: Payer: Self-pay | Admitting: Geriatric Medicine

## 2020-06-03 DIAGNOSIS — R102 Pelvic and perineal pain: Secondary | ICD-10-CM

## 2020-06-11 ENCOUNTER — Ambulatory Visit
Admission: RE | Admit: 2020-06-11 | Discharge: 2020-06-11 | Disposition: A | Discharge status: Home / Self Care | Payer: Medicare Other | Source: Ambulatory Visit | Attending: Geriatric Medicine | Admitting: Geriatric Medicine

## 2020-06-11 DIAGNOSIS — R102 Pelvic and perineal pain: Secondary | ICD-10-CM

## 2020-11-26 DIAGNOSIS — H9313 Tinnitus, bilateral: Secondary | ICD-10-CM | POA: Diagnosis not present

## 2020-11-26 DIAGNOSIS — H903 Sensorineural hearing loss, bilateral: Secondary | ICD-10-CM | POA: Diagnosis not present

## 2020-12-02 IMAGING — US US PELVIS COMPLETE WITH TRANSVAGINAL
1 series · 14 of 25 positions shown · non-contrast
Comparison: CT 02/21/2016, ultrasound 08/07/2015

CLINICAL DATA: Pelvic pain

EXAM:
TRANSABDOMINAL AND TRANSVAGINAL ULTRASOUND OF PELVIS
TECHNIQUE: Both transabdominal and transvaginal ultrasound examinations of the
pelvis were performed. Transabdominal technique was performed for
global imaging of the pelvis including uterus, ovaries, adnexal
regions, and pelvic cul-de-sac. It was necessary to proceed with
endovaginal exam following the transabdominal exam to visualize the
adnexa and ovaries.

[Series 1: us pelvis complete with transvaginal · 0.20mm/px · 14 of 42 slices shown]
[im 1/42]
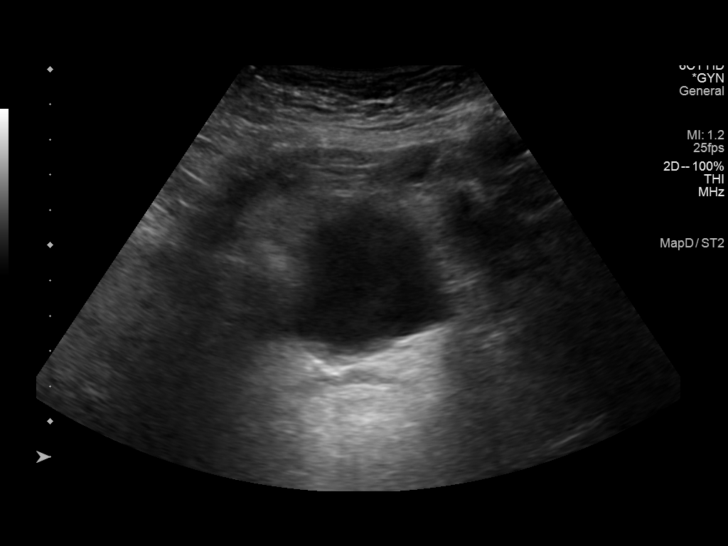
[im 4/42]
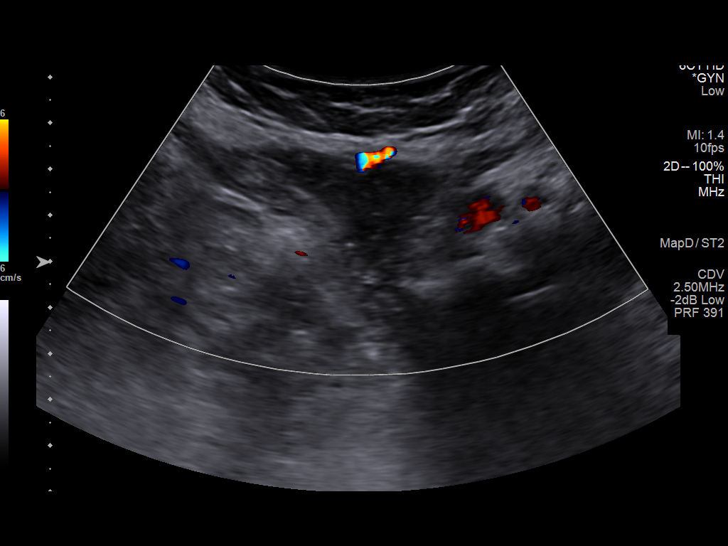
[im 7/42]
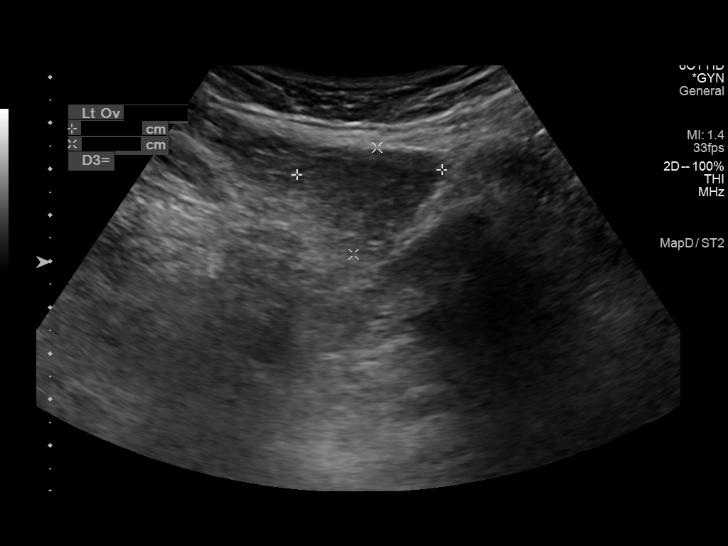
[im 11/42]
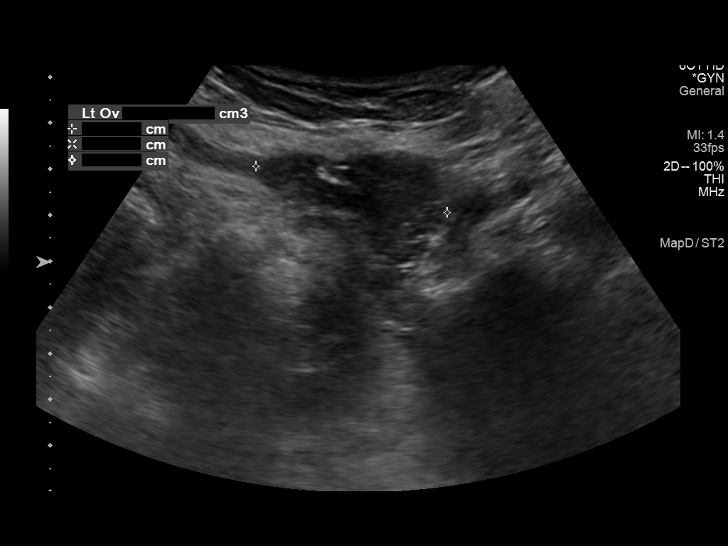
[im 14/42]
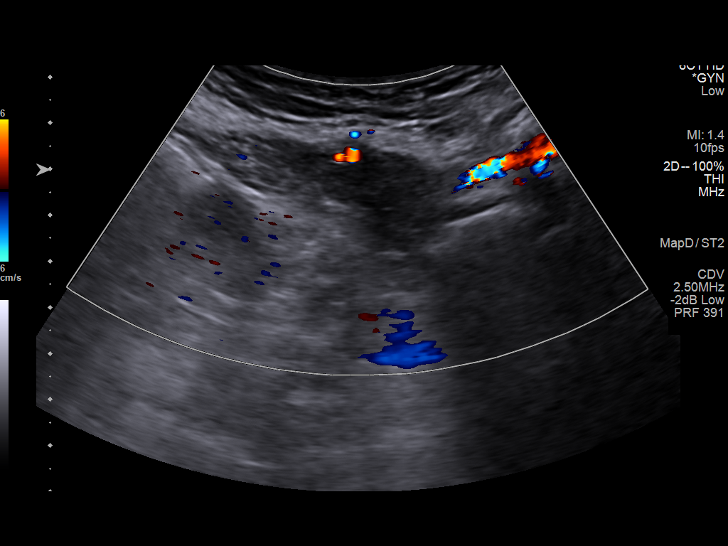
[im 16/42]
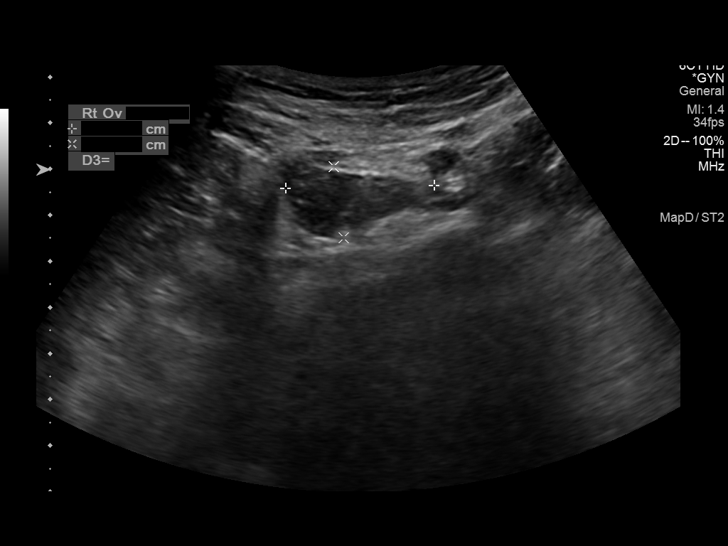
[im 19/42]
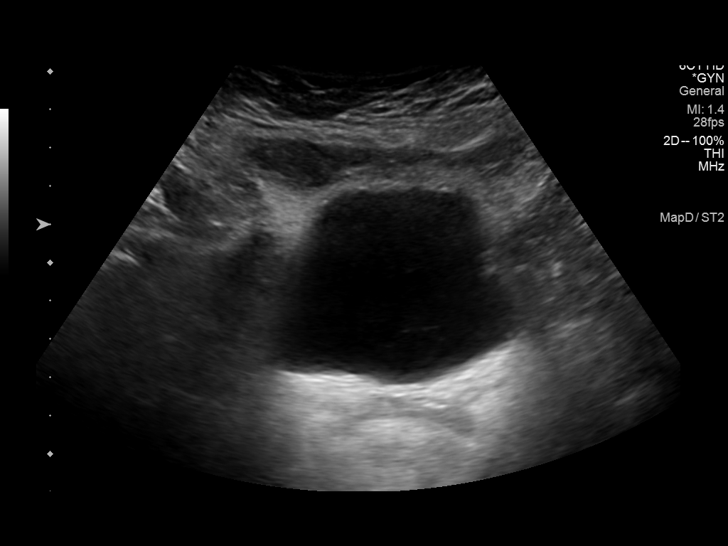
[im 23/42]
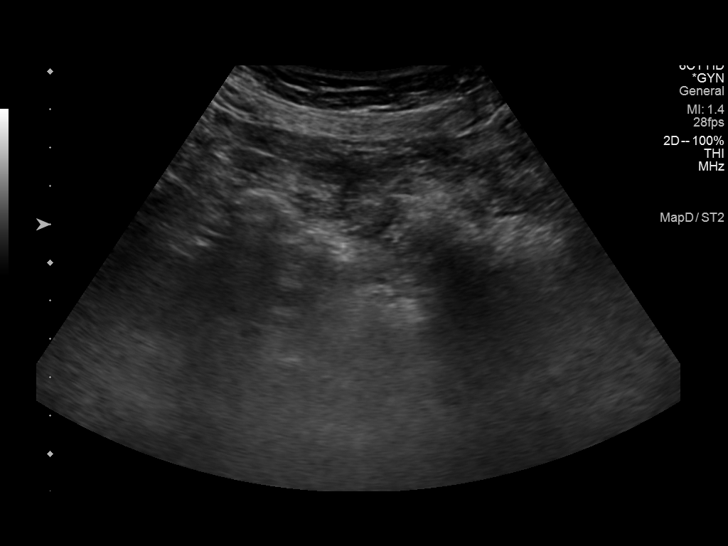
[im 26/42]
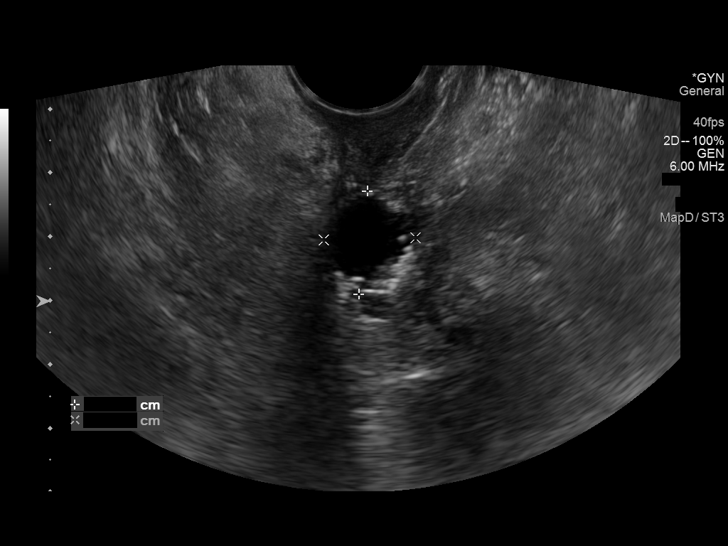
[im 28/42]
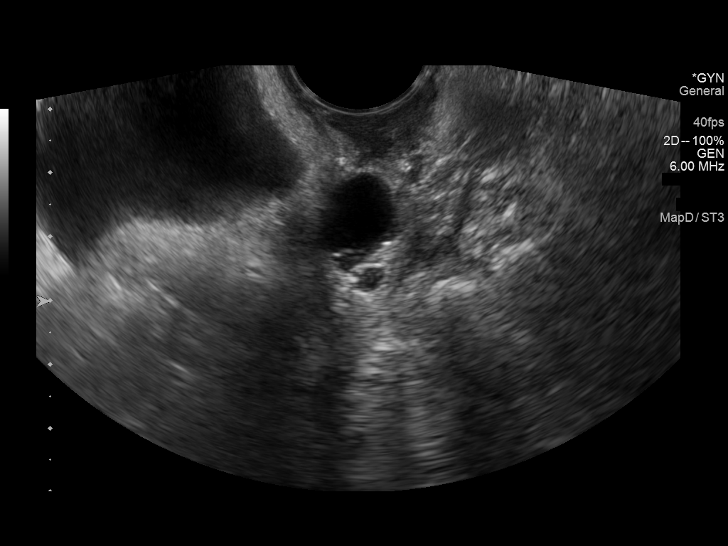
[im 31/42]
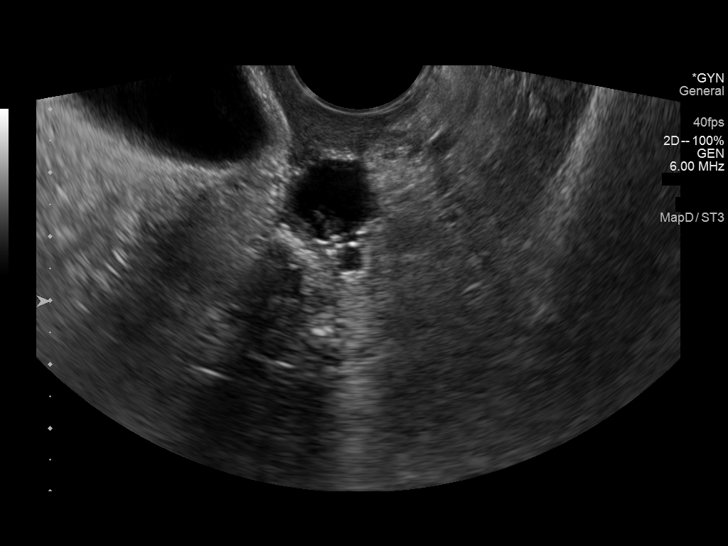
[im 35/42]
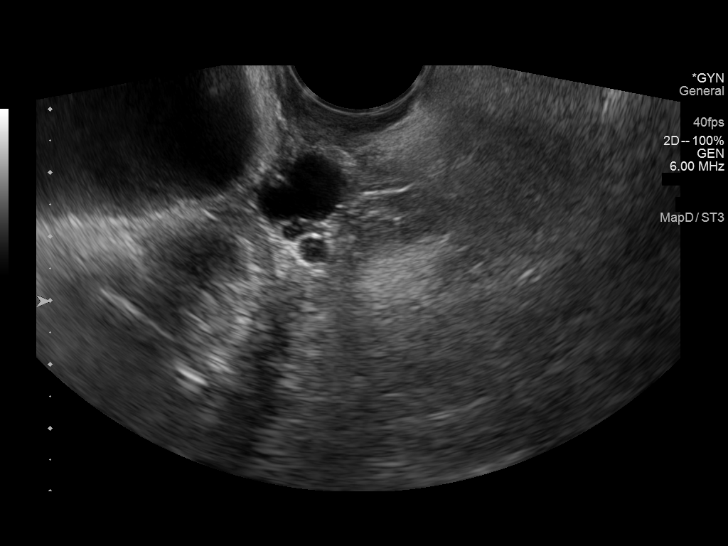
[im 38/42]
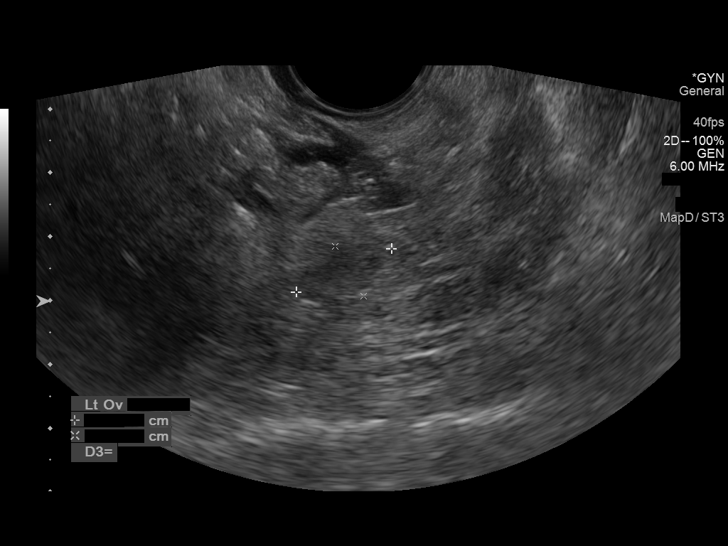
[im 42/42]
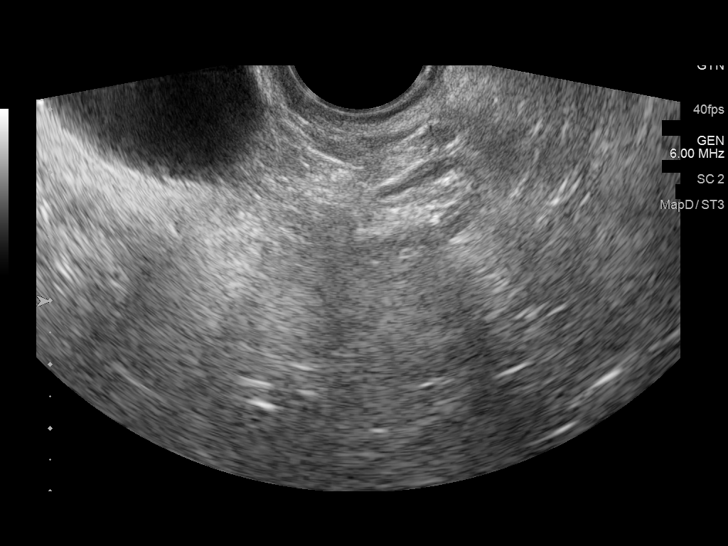

[14 of 25 positions shown; findings below may reference images not displayed]

FINDINGS: Uterus

Status post hysterectomy. Midline complex cysts measuring 1.9 by
by 1.6 cm potentially in the region of the cervix or vaginal cuff.

Endometrium

Status post hysterectomy

Right ovary

Measurements: 3.2 x 1.6 x 2.4 cm = volume: 6.4 mL. Normal
appearance/no adnexal mass.

Left ovary

Measurements: 3.1 x 2.4 x 4.3 cm = volume: 16.7 mL. Normal
appearance/no adnexal mass.

Other findings

No abnormal free fluid.
IMPRESSION: 1. Status post hysterectomy. Midline complex cysts measuring up to
1.9 cm potentially in the region of the cervix/vaginal cuff,
recommend correlation with direct visualization.
2. Otherwise negative examination.

## 2020-12-04 DIAGNOSIS — I10 Essential (primary) hypertension: Secondary | ICD-10-CM | POA: Diagnosis not present

## 2020-12-04 DIAGNOSIS — M810 Age-related osteoporosis without current pathological fracture: Secondary | ICD-10-CM | POA: Diagnosis not present

## 2020-12-04 DIAGNOSIS — K219 Gastro-esophageal reflux disease without esophagitis: Secondary | ICD-10-CM | POA: Diagnosis not present

## 2020-12-04 DIAGNOSIS — E7800 Pure hypercholesterolemia, unspecified: Secondary | ICD-10-CM | POA: Diagnosis not present

## 2020-12-30 DIAGNOSIS — I10 Essential (primary) hypertension: Secondary | ICD-10-CM | POA: Diagnosis not present

## 2020-12-30 DIAGNOSIS — M810 Age-related osteoporosis without current pathological fracture: Secondary | ICD-10-CM | POA: Diagnosis not present

## 2020-12-30 DIAGNOSIS — K219 Gastro-esophageal reflux disease without esophagitis: Secondary | ICD-10-CM | POA: Diagnosis not present

## 2020-12-30 DIAGNOSIS — E7800 Pure hypercholesterolemia, unspecified: Secondary | ICD-10-CM | POA: Diagnosis not present

## 2021-01-10 DIAGNOSIS — M810 Age-related osteoporosis without current pathological fracture: Secondary | ICD-10-CM | POA: Diagnosis not present

## 2021-01-17 DIAGNOSIS — H6123 Impacted cerumen, bilateral: Secondary | ICD-10-CM | POA: Diagnosis not present

## 2021-01-22 DIAGNOSIS — Z1231 Encounter for screening mammogram for malignant neoplasm of breast: Secondary | ICD-10-CM | POA: Diagnosis not present

## 2021-03-27 DIAGNOSIS — H6123 Impacted cerumen, bilateral: Secondary | ICD-10-CM | POA: Diagnosis not present

## 2021-04-01 DIAGNOSIS — K219 Gastro-esophageal reflux disease without esophagitis: Secondary | ICD-10-CM | POA: Diagnosis not present

## 2021-04-01 DIAGNOSIS — I10 Essential (primary) hypertension: Secondary | ICD-10-CM | POA: Diagnosis not present

## 2021-04-01 DIAGNOSIS — M810 Age-related osteoporosis without current pathological fracture: Secondary | ICD-10-CM | POA: Diagnosis not present

## 2021-04-01 DIAGNOSIS — E7800 Pure hypercholesterolemia, unspecified: Secondary | ICD-10-CM | POA: Diagnosis not present

## 2021-05-12 DIAGNOSIS — H35033 Hypertensive retinopathy, bilateral: Secondary | ICD-10-CM | POA: Diagnosis not present

## 2021-05-12 DIAGNOSIS — H353132 Nonexudative age-related macular degeneration, bilateral, intermediate dry stage: Secondary | ICD-10-CM | POA: Diagnosis not present

## 2021-05-12 DIAGNOSIS — H35373 Puckering of macula, bilateral: Secondary | ICD-10-CM | POA: Diagnosis not present

## 2021-05-12 DIAGNOSIS — H43811 Vitreous degeneration, right eye: Secondary | ICD-10-CM | POA: Diagnosis not present

## 2021-06-09 DIAGNOSIS — K219 Gastro-esophageal reflux disease without esophagitis: Secondary | ICD-10-CM | POA: Diagnosis not present

## 2021-06-09 DIAGNOSIS — I10 Essential (primary) hypertension: Secondary | ICD-10-CM | POA: Diagnosis not present

## 2021-06-09 DIAGNOSIS — E7800 Pure hypercholesterolemia, unspecified: Secondary | ICD-10-CM | POA: Diagnosis not present

## 2021-06-09 DIAGNOSIS — M810 Age-related osteoporosis without current pathological fracture: Secondary | ICD-10-CM | POA: Diagnosis not present

## 2021-07-11 DIAGNOSIS — L570 Actinic keratosis: Secondary | ICD-10-CM | POA: Diagnosis not present

## 2021-07-11 DIAGNOSIS — D1801 Hemangioma of skin and subcutaneous tissue: Secondary | ICD-10-CM | POA: Diagnosis not present

## 2021-07-11 DIAGNOSIS — D2371 Other benign neoplasm of skin of right lower limb, including hip: Secondary | ICD-10-CM | POA: Diagnosis not present

## 2021-07-11 DIAGNOSIS — D2261 Melanocytic nevi of right upper limb, including shoulder: Secondary | ICD-10-CM | POA: Diagnosis not present

## 2021-07-11 DIAGNOSIS — M810 Age-related osteoporosis without current pathological fracture: Secondary | ICD-10-CM | POA: Diagnosis not present

## 2021-07-11 DIAGNOSIS — L820 Inflamed seborrheic keratosis: Secondary | ICD-10-CM | POA: Diagnosis not present

## 2021-07-11 DIAGNOSIS — Z85820 Personal history of malignant melanoma of skin: Secondary | ICD-10-CM | POA: Diagnosis not present

## 2021-07-11 DIAGNOSIS — L821 Other seborrheic keratosis: Secondary | ICD-10-CM | POA: Diagnosis not present

## 2021-07-11 DIAGNOSIS — D2271 Melanocytic nevi of right lower limb, including hip: Secondary | ICD-10-CM | POA: Diagnosis not present

## 2021-08-27 DIAGNOSIS — K219 Gastro-esophageal reflux disease without esophagitis: Secondary | ICD-10-CM | POA: Diagnosis not present

## 2021-08-27 DIAGNOSIS — I10 Essential (primary) hypertension: Secondary | ICD-10-CM | POA: Diagnosis not present

## 2021-08-27 DIAGNOSIS — M810 Age-related osteoporosis without current pathological fracture: Secondary | ICD-10-CM | POA: Diagnosis not present

## 2021-08-27 DIAGNOSIS — E7800 Pure hypercholesterolemia, unspecified: Secondary | ICD-10-CM | POA: Diagnosis not present

## 2021-09-18 DIAGNOSIS — H6123 Impacted cerumen, bilateral: Secondary | ICD-10-CM | POA: Diagnosis not present

## 2021-11-04 DIAGNOSIS — H35373 Puckering of macula, bilateral: Secondary | ICD-10-CM | POA: Diagnosis not present

## 2021-11-04 DIAGNOSIS — H35033 Hypertensive retinopathy, bilateral: Secondary | ICD-10-CM | POA: Diagnosis not present

## 2021-11-04 DIAGNOSIS — H353132 Nonexudative age-related macular degeneration, bilateral, intermediate dry stage: Secondary | ICD-10-CM | POA: Diagnosis not present

## 2021-11-04 DIAGNOSIS — H43811 Vitreous degeneration, right eye: Secondary | ICD-10-CM | POA: Diagnosis not present

## 2021-11-05 DIAGNOSIS — H3530 Unspecified macular degeneration: Secondary | ICD-10-CM | POA: Diagnosis not present

## 2021-11-05 DIAGNOSIS — I10 Essential (primary) hypertension: Secondary | ICD-10-CM | POA: Diagnosis not present

## 2021-11-05 DIAGNOSIS — Z79899 Other long term (current) drug therapy: Secondary | ICD-10-CM | POA: Diagnosis not present

## 2021-11-05 DIAGNOSIS — E7800 Pure hypercholesterolemia, unspecified: Secondary | ICD-10-CM | POA: Diagnosis not present

## 2021-11-05 DIAGNOSIS — Z1389 Encounter for screening for other disorder: Secondary | ICD-10-CM | POA: Diagnosis not present

## 2021-11-05 DIAGNOSIS — Z0000 Encounter for general adult medical examination without abnormal findings: Secondary | ICD-10-CM | POA: Diagnosis not present

## 2021-12-03 DIAGNOSIS — I10 Essential (primary) hypertension: Secondary | ICD-10-CM | POA: Diagnosis not present

## 2021-12-18 DIAGNOSIS — H6123 Impacted cerumen, bilateral: Secondary | ICD-10-CM | POA: Diagnosis not present

## 2022-01-02 DIAGNOSIS — E7800 Pure hypercholesterolemia, unspecified: Secondary | ICD-10-CM | POA: Diagnosis not present

## 2022-01-02 DIAGNOSIS — M810 Age-related osteoporosis without current pathological fracture: Secondary | ICD-10-CM | POA: Diagnosis not present

## 2022-01-02 DIAGNOSIS — I10 Essential (primary) hypertension: Secondary | ICD-10-CM | POA: Diagnosis not present

## 2022-02-03 DIAGNOSIS — Z1231 Encounter for screening mammogram for malignant neoplasm of breast: Secondary | ICD-10-CM | POA: Diagnosis not present

## 2022-02-09 DIAGNOSIS — M810 Age-related osteoporosis without current pathological fracture: Secondary | ICD-10-CM | POA: Diagnosis not present

## 2022-02-09 DIAGNOSIS — I10 Essential (primary) hypertension: Secondary | ICD-10-CM | POA: Diagnosis not present

## 2022-02-09 DIAGNOSIS — J9801 Acute bronchospasm: Secondary | ICD-10-CM | POA: Diagnosis not present

## 2022-03-19 DIAGNOSIS — H6123 Impacted cerumen, bilateral: Secondary | ICD-10-CM | POA: Diagnosis not present

## 2022-04-28 DIAGNOSIS — H35373 Puckering of macula, bilateral: Secondary | ICD-10-CM | POA: Diagnosis not present

## 2022-04-28 DIAGNOSIS — H353132 Nonexudative age-related macular degeneration, bilateral, intermediate dry stage: Secondary | ICD-10-CM | POA: Diagnosis not present

## 2022-05-12 ENCOUNTER — Ambulatory Visit (HOSPITAL_COMMUNITY)
Admission: RE | Admit: 2022-05-12 | Discharge: 2022-05-12 | Disposition: A | Discharge status: Home / Self Care | Payer: Medicare Other | Source: Ambulatory Visit | Attending: Cardiovascular Disease | Admitting: Cardiovascular Disease

## 2022-05-12 ENCOUNTER — Other Ambulatory Visit (HOSPITAL_COMMUNITY): Payer: Self-pay | Admitting: Sports Medicine

## 2022-05-12 DIAGNOSIS — M79662 Pain in left lower leg: Secondary | ICD-10-CM | POA: Diagnosis not present

## 2022-05-12 DIAGNOSIS — M79605 Pain in left leg: Secondary | ICD-10-CM | POA: Diagnosis not present

## 2022-05-12 DIAGNOSIS — M7989 Other specified soft tissue disorders: Secondary | ICD-10-CM | POA: Insufficient documentation

## 2022-05-12 DIAGNOSIS — M79604 Pain in right leg: Secondary | ICD-10-CM | POA: Insufficient documentation

## 2022-05-12 DIAGNOSIS — M79661 Pain in right lower leg: Secondary | ICD-10-CM | POA: Diagnosis not present

## 2022-05-12 DIAGNOSIS — M25571 Pain in right ankle and joints of right foot: Secondary | ICD-10-CM | POA: Diagnosis not present

## 2022-05-21 DIAGNOSIS — R29898 Other symptoms and signs involving the musculoskeletal system: Secondary | ICD-10-CM | POA: Diagnosis not present

## 2022-05-21 DIAGNOSIS — M76821 Posterior tibial tendinitis, right leg: Secondary | ICD-10-CM | POA: Diagnosis not present

## 2022-05-21 DIAGNOSIS — M5416 Radiculopathy, lumbar region: Secondary | ICD-10-CM | POA: Diagnosis not present

## 2022-06-10 DIAGNOSIS — M76821 Posterior tibial tendinitis, right leg: Secondary | ICD-10-CM | POA: Diagnosis not present

## 2022-06-16 DIAGNOSIS — I10 Essential (primary) hypertension: Secondary | ICD-10-CM | POA: Diagnosis not present

## 2022-06-16 DIAGNOSIS — M26609 Unspecified temporomandibular joint disorder, unspecified side: Secondary | ICD-10-CM | POA: Diagnosis not present

## 2022-06-16 DIAGNOSIS — Z79899 Other long term (current) drug therapy: Secondary | ICD-10-CM | POA: Diagnosis not present

## 2022-06-22 DIAGNOSIS — H6123 Impacted cerumen, bilateral: Secondary | ICD-10-CM | POA: Diagnosis not present

## 2022-06-22 DIAGNOSIS — H903 Sensorineural hearing loss, bilateral: Secondary | ICD-10-CM | POA: Diagnosis not present

## 2022-07-16 DIAGNOSIS — L570 Actinic keratosis: Secondary | ICD-10-CM | POA: Diagnosis not present

## 2022-07-16 DIAGNOSIS — D2261 Melanocytic nevi of right upper limb, including shoulder: Secondary | ICD-10-CM | POA: Diagnosis not present

## 2022-07-16 DIAGNOSIS — D1801 Hemangioma of skin and subcutaneous tissue: Secondary | ICD-10-CM | POA: Diagnosis not present

## 2022-07-16 DIAGNOSIS — D485 Neoplasm of uncertain behavior of skin: Secondary | ICD-10-CM | POA: Diagnosis not present

## 2022-07-16 DIAGNOSIS — D2271 Melanocytic nevi of right lower limb, including hip: Secondary | ICD-10-CM | POA: Diagnosis not present

## 2022-07-16 DIAGNOSIS — D225 Melanocytic nevi of trunk: Secondary | ICD-10-CM | POA: Diagnosis not present

## 2022-07-16 DIAGNOSIS — D2262 Melanocytic nevi of left upper limb, including shoulder: Secondary | ICD-10-CM | POA: Diagnosis not present

## 2022-07-16 DIAGNOSIS — D2272 Melanocytic nevi of left lower limb, including hip: Secondary | ICD-10-CM | POA: Diagnosis not present

## 2022-07-16 DIAGNOSIS — L821 Other seborrheic keratosis: Secondary | ICD-10-CM | POA: Diagnosis not present

## 2022-07-16 DIAGNOSIS — Z85820 Personal history of malignant melanoma of skin: Secondary | ICD-10-CM | POA: Diagnosis not present

## 2022-08-24 DIAGNOSIS — M810 Age-related osteoporosis without current pathological fracture: Secondary | ICD-10-CM | POA: Diagnosis not present

## 2022-09-22 DIAGNOSIS — H6123 Impacted cerumen, bilateral: Secondary | ICD-10-CM | POA: Diagnosis not present

## 2022-10-12 DIAGNOSIS — M25551 Pain in right hip: Secondary | ICD-10-CM | POA: Diagnosis not present

## 2022-10-12 DIAGNOSIS — M5137 Other intervertebral disc degeneration, lumbosacral region: Secondary | ICD-10-CM | POA: Diagnosis not present

## 2022-10-17 DIAGNOSIS — M545 Low back pain, unspecified: Secondary | ICD-10-CM | POA: Diagnosis not present

## 2022-10-27 DIAGNOSIS — M545 Low back pain, unspecified: Secondary | ICD-10-CM | POA: Diagnosis not present

## 2022-11-20 DIAGNOSIS — M5416 Radiculopathy, lumbar region: Secondary | ICD-10-CM | POA: Diagnosis not present

## 2023-11-09 ENCOUNTER — Ambulatory Visit (INDEPENDENT_AMBULATORY_CARE_PROVIDER_SITE_OTHER): Payer: Self-pay | Admitting: Audiology

## 2023-11-09 ENCOUNTER — Ambulatory Visit (INDEPENDENT_AMBULATORY_CARE_PROVIDER_SITE_OTHER): Payer: Medicare Other | Admitting: Otolaryngology

## 2023-11-09 DIAGNOSIS — H6123 Impacted cerumen, bilateral: Secondary | ICD-10-CM | POA: Insufficient documentation

## 2023-11-09 DIAGNOSIS — Z719 Counseling, unspecified: Secondary | ICD-10-CM

## 2023-11-09 NOTE — Progress Notes (Signed)
Patient ID: Kendra Cochran, female   DOB: 1941-03-23, 82 y.o.   MRN: [id number]  Procedure: Bilateral cerumen disimpaction.   Indication: Cerumen impaction, resulting in ear discomfort and conductive hearing loss.   Description: The patient is placed supine on the operating table. Under the operating microscope, the right ear canal is examined and is noted to be impacted with cerumen. The cerumen is carefully removed with a combination of suction catheters, cerumen curette, and alligator forceps. After the cerumen removal, the ear canal and tympanic membrane are noted to be normal. No middle ear effusion is noted. The same procedure is then repeated on the left side without exception. The patient tolerated the procedure well.  Follow-up care:  The patient is instructed not to use Q-tips to clean the ear canals. The patient will follow up in 4 months.

## 2023-11-09 NOTE — Progress Notes (Signed)
Patient was seen today for a hearing aid check. She currently wears Kendra Cochran hearing aids bilaterally. She wears a cShell on the right side and a SlimTip on the left side. Otoscopy revealed moderate cerumen bilaterally; Dr. Suszanne Conners removed the wax today. Replaced the wax filters, brushed the microphones, and cleaned the venting. Discussed the difference between the CeruShield and CeruDisk. She was happy with the changes today. Instructed patient to call if any concerns arise.  No charge for today's visit.   Kendra Cochran, AUD, CCC-A 11/09/23

## 2024-01-27 ENCOUNTER — Ambulatory Visit (INDEPENDENT_AMBULATORY_CARE_PROVIDER_SITE_OTHER): Admitting: Audiology

## 2024-01-27 DIAGNOSIS — H903 Sensorineural hearing loss, bilateral: Secondary | ICD-10-CM

## 2024-01-27 NOTE — Progress Notes (Signed)
  [address], [address] [place], [place] [address] [phone]  Hearing Aid Check     Kendra Cochran comes for a scheduled appointment for a hearing aid check.   Accompanied [id number]    Right Left  Hearing aid manufacturer [name]  SN: [id number] [name]  SN: [id number]  Hearing aid style    Hearing aid battery rechargeable rechargeable  Receiver    Dome/ custom earpiece    Retention wire    Warranty expiration date 02-23-2023 02-23-2023  Loss and Damage expired expired  Initial fitting date 12-05-2020 12-05-2020  Device was fit at: [facility]    Chief complaint: Patient reports the left aid stopped working.  Actions taken: Inspection of the device and listening check showed that it was not working. Replaced filter with not improvement. The receiver wire was damaged. Patient agreed to pay for a new one since it is out of warranty.  Services fee: $110 was paid at checkout.    Recommend: Return for a hearing aid check , as needed. Return for a hearing evaluation and to see an ENT, if concerns with hearing changes arise.    Mattox Schorr MARIE LEROUX-MARTINEZ, AUD

## 2024-03-21 ENCOUNTER — Ambulatory Visit (INDEPENDENT_AMBULATORY_CARE_PROVIDER_SITE_OTHER): Payer: Medicare Other

## 2024-04-07 ENCOUNTER — Ambulatory Visit (INDEPENDENT_AMBULATORY_CARE_PROVIDER_SITE_OTHER): Admitting: Audiology

## 2024-04-07 ENCOUNTER — Ambulatory Visit (INDEPENDENT_AMBULATORY_CARE_PROVIDER_SITE_OTHER): Admitting: Otolaryngology

## 2024-04-07 DIAGNOSIS — H6123 Impacted cerumen, bilateral: Secondary | ICD-10-CM | POA: Diagnosis not present

## 2024-04-07 DIAGNOSIS — H903 Sensorineural hearing loss, bilateral: Secondary | ICD-10-CM

## 2024-04-07 DIAGNOSIS — H9313 Tinnitus, bilateral: Secondary | ICD-10-CM | POA: Diagnosis not present

## 2024-04-07 NOTE — Progress Notes (Signed)
  [address], [address] [place], [place] [address] [phone]  Audiological Evaluation    Name: Kendra Cochran     DOB:   [birth year]      MRN:   [id number]                                                                                     Service Date: 04/07/2024         Patient comes today after Dr. Darlin Ehrlich, ENT sent a referral for a hearing evaluation due to concerns with hearing loss.   Symptoms Yes Details  Hearing loss  [x]  Previous audiogram completed at [facility] on 10-29-2020.  Tinnitus  []    Ear pain/ infections/pressure  []    Balance problems  []    Noise exposure history  []    Previous ear surgeries  []    Family history of hearing loss  []    Amplification  [x]  Has a set of hearing aids  Other  []      Otoscopy: Right ear: Clear external ear canals and notable landmarks visualized on the tympanic membrane. Left ear:  Clear external ear canals and notable landmarks visualized on the tympanic membrane.  Tympanometry: Right ear: Type A- Normal external ear canal volume with normal middle ear pressure and tympanic membrane compliance. Left ear: Type A- Normal external ear canal volume with normal middle ear pressure and tympanic membrane compliance.    Pure tone Audiometry: Right ear- Normal hearing from 125-250 Hz, then mild to severe sensorineural hearing loss from 500 Hz - 8000 Hz. Left ear-  Normal hearing from 125-250 Hz, then mild to moderately;y severe sensorineural hearing loss from 500 Hz - 8000 Hz.    Speech Audiometry: Right ear- Speech Reception Threshold (SRT) was obtained at 50 dBHL. Left ear-Speech Reception Threshold (SRT) was obtained at 50 dBHL.   Word Recognition Score Tested using NU-6 (MLV) Right ear: 48% was obtained at a presentation level of 90 dBHL (MCL) with contralateral masking which is deemed as  poor. Left ear: 48% was obtained at a presentation level of 90 dBHL (MCL) with contralateral masking which is deemed as  poor.    The hearing test results were completed under headphones and results are deemed to be of good reliability. Test technique:  conventional    Impression: No significant overall hearing changes noted when compared to her previous audiogram completed at [facility].   Recommendations: Follow up with ENT as scheduled for today. Return for a hearing evaluation if concerns with hearing changes arise or per MD recommendation. Continue full time hearing aid use.   Of note, patient had changed her phone 3 weeks ago and wanted the aids to be connected to her phone. This was also completed during her appointment. Patient is aware about the different audiology appointment types.    Gailya Tauer MARIE LEROUX-MARTINEZ, AUD

## 2024-04-09 DIAGNOSIS — H9313 Tinnitus, bilateral: Secondary | ICD-10-CM | POA: Insufficient documentation

## 2024-04-09 DIAGNOSIS — H903 Sensorineural hearing loss, bilateral: Secondary | ICD-10-CM | POA: Insufficient documentation

## 2024-04-09 NOTE — Progress Notes (Signed)
 Patient ID: Kendra Cochran, female   DOB: July 12, 1941, 83 y.o.   MRN: [id number]  Follow-up: Hearing loss and tinnitus  HPI: The patient is an 83 year old female who returns today for follow-up evaluation.  The patient was previously seen for bilateral hearing loss and tinnitus.  She was previously noted to have bilateral high-frequency sensorineural hearing loss.  Her tinnitus was likely a direct result of her hearing loss.  In addition, the patient also has a history of frequent recurrent cerumen impaction.  The patient returns today reporting no significant change in her hearing.  She was recently fitted with bilateral hearing aids.  Currently she denies any otalgia, otorrhea, or vertigo.  Exam: General: Communicates without difficulty, well nourished, no acute distress. Head: Normocephalic, no evidence injury, no tenderness, facial buttresses intact without stepoff. Face/sinus: No tenderness to palpation and percussion. Facial movement is normal and symmetric. Eyes: PERRL, EOMI. No scleral icterus, conjunctivae clear. Neuro: CN II exam reveals vision grossly intact.  No nystagmus at any point of gaze. Ears: Auricles well formed without lesions.  Bilateral cerumen impaction.  Nose: External evaluation reveals normal support and skin without lesions.  Dorsum is intact.  Anterior rhinoscopy reveals congested mucosa over anterior aspect of inferior turbinates and intact septum.  No purulence noted. Oral:  Oral cavity and oropharynx are intact, symmetric, without erythema or edema.  Mucosa is moist without lesions. Neck: Full range of motion without pain.  There is no significant lymphadenopathy.  No masses palpable.  Thyroid bed within normal limits to palpation.  Parotid glands and submandibular glands equal bilaterally without mass.  Trachea is midline. Neuro:  CN 2-12 grossly intact.   Procedure: Bilateral cerumen disimpaction Anesthesia: None Description: Under the operating microscope, the cerumen  is carefully removed with a combination of cerumen currette, alligator forceps, and suction catheters.  After the cerumen is removed, the TMs are noted to be normal.  No mass, erythema, or lesions. The patient tolerated the procedure well.   Assessment: 1.  Bilateral recurrent cerumen impaction.  After the cerumen disimpaction procedure, both tympanic membranes and middle ear spaces are noted to be normal. 2.  Bilateral sensorineural hearing loss.  The patient was recently fitted with bilateral hearing aids. 3.  Bilateral subjective tinnitus.  Plan: 1.  Otomicroscopy with bilateral cerumen disimpaction. 2.  The physical exam findings are reviewed with the patient. 3.  Continue the use of her hearing aids. 4.  The patient will return for reevaluation in 4 months.

## 2024-04-18 ENCOUNTER — Encounter: Payer: Self-pay | Admitting: Audiology

## 2024-05-22 DIAGNOSIS — I10 Essential (primary) hypertension: Secondary | ICD-10-CM | POA: Diagnosis not present

## 2024-05-22 DIAGNOSIS — J4520 Mild intermittent asthma, uncomplicated: Secondary | ICD-10-CM | POA: Diagnosis not present

## 2024-05-22 DIAGNOSIS — E7800 Pure hypercholesterolemia, unspecified: Secondary | ICD-10-CM | POA: Diagnosis not present

## 2024-05-22 DIAGNOSIS — M810 Age-related osteoporosis without current pathological fracture: Secondary | ICD-10-CM | POA: Diagnosis not present

## 2024-06-22 DIAGNOSIS — M810 Age-related osteoporosis without current pathological fracture: Secondary | ICD-10-CM | POA: Diagnosis not present

## 2024-06-22 DIAGNOSIS — I10 Essential (primary) hypertension: Secondary | ICD-10-CM | POA: Diagnosis not present

## 2024-06-22 DIAGNOSIS — E7800 Pure hypercholesterolemia, unspecified: Secondary | ICD-10-CM | POA: Diagnosis not present

## 2024-06-22 DIAGNOSIS — J4520 Mild intermittent asthma, uncomplicated: Secondary | ICD-10-CM | POA: Diagnosis not present

## 2024-07-17 DIAGNOSIS — D235 Other benign neoplasm of skin of trunk: Secondary | ICD-10-CM | POA: Diagnosis not present

## 2024-07-17 DIAGNOSIS — D485 Neoplasm of uncertain behavior of skin: Secondary | ICD-10-CM | POA: Diagnosis not present

## 2024-07-17 DIAGNOSIS — L820 Inflamed seborrheic keratosis: Secondary | ICD-10-CM | POA: Diagnosis not present

## 2024-07-17 DIAGNOSIS — L570 Actinic keratosis: Secondary | ICD-10-CM | POA: Diagnosis not present

## 2024-07-17 DIAGNOSIS — Z85820 Personal history of malignant melanoma of skin: Secondary | ICD-10-CM | POA: Diagnosis not present

## 2024-07-17 DIAGNOSIS — L821 Other seborrheic keratosis: Secondary | ICD-10-CM | POA: Diagnosis not present

## 2024-07-23 DIAGNOSIS — J4520 Mild intermittent asthma, uncomplicated: Secondary | ICD-10-CM | POA: Diagnosis not present

## 2024-07-23 DIAGNOSIS — I10 Essential (primary) hypertension: Secondary | ICD-10-CM | POA: Diagnosis not present

## 2024-07-23 DIAGNOSIS — E7800 Pure hypercholesterolemia, unspecified: Secondary | ICD-10-CM | POA: Diagnosis not present

## 2024-07-23 DIAGNOSIS — M810 Age-related osteoporosis without current pathological fracture: Secondary | ICD-10-CM | POA: Diagnosis not present

## 2024-08-01 DIAGNOSIS — H43813 Vitreous degeneration, bilateral: Secondary | ICD-10-CM | POA: Diagnosis not present

## 2024-08-01 DIAGNOSIS — H35373 Puckering of macula, bilateral: Secondary | ICD-10-CM | POA: Diagnosis not present

## 2024-08-01 DIAGNOSIS — H353132 Nonexudative age-related macular degeneration, bilateral, intermediate dry stage: Secondary | ICD-10-CM | POA: Diagnosis not present

## 2024-08-01 DIAGNOSIS — H35033 Hypertensive retinopathy, bilateral: Secondary | ICD-10-CM | POA: Diagnosis not present

## 2024-08-01 DIAGNOSIS — Z961 Presence of intraocular lens: Secondary | ICD-10-CM | POA: Diagnosis not present

## 2024-08-08 ENCOUNTER — Encounter (INDEPENDENT_AMBULATORY_CARE_PROVIDER_SITE_OTHER): Payer: Self-pay | Admitting: Otolaryngology

## 2024-08-08 ENCOUNTER — Ambulatory Visit (INDEPENDENT_AMBULATORY_CARE_PROVIDER_SITE_OTHER): Payer: Self-pay | Admitting: Audiology

## 2024-08-08 ENCOUNTER — Ambulatory Visit (INDEPENDENT_AMBULATORY_CARE_PROVIDER_SITE_OTHER): Admitting: Otolaryngology

## 2024-08-08 VITALS — BP 134/77 | HR 74 | Temp 98.0°F

## 2024-08-08 DIAGNOSIS — H903 Sensorineural hearing loss, bilateral: Secondary | ICD-10-CM | POA: Diagnosis not present

## 2024-08-08 DIAGNOSIS — H6123 Impacted cerumen, bilateral: Secondary | ICD-10-CM

## 2024-08-08 DIAGNOSIS — H9313 Tinnitus, bilateral: Secondary | ICD-10-CM

## 2024-08-08 NOTE — Progress Notes (Unsigned)
 Patient ID: Kendra Cochran, female   DOB: 08/25/1941, 83 y.o.   MRN: [id number]  Follow-up: Hearing loss and tinnitus  HPI: The patient is an 83 year old female who returns today for follow-up evaluation.  The patient was previously seen for bilateral hearing loss and tinnitus.  She was previously noted to have bilateral high-frequency sensorineural hearing loss.  Her tinnitus was likely a direct result of her hearing loss.  In addition, the patient also has a history of frequent recurrent cerumen impaction.  The patient returns today reporting no significant change in her hearing.  She was recently fitted with bilateral hearing aids.  Currently she denies any otalgia, otorrhea, or vertigo.  Exam: General: Communicates without difficulty, well nourished, no acute distress. Head: Normocephalic, no evidence injury, no tenderness, facial buttresses intact without stepoff. Face/sinus: No tenderness to palpation and percussion. Facial movement is normal and symmetric. Eyes: PERRL, EOMI. No scleral icterus, conjunctivae clear. Neuro: CN II exam reveals vision grossly intact.  No nystagmus at any point of gaze. Ears: Auricles well formed without lesions.  Bilateral cerumen impaction.  Nose: External evaluation reveals normal support and skin without lesions.  Dorsum is intact.  Anterior rhinoscopy reveals congested mucosa over anterior aspect of inferior turbinates and intact septum.  No purulence noted. Oral:  Oral cavity and oropharynx are intact, symmetric, without erythema or edema.  Mucosa is moist without lesions. Neck: Full range of motion without pain.  There is no significant lymphadenopathy.  No masses palpable.  Thyroid bed within normal limits to palpation.  Parotid glands and submandibular glands equal bilaterally without mass.  Trachea is midline. Neuro:  CN 2-12 grossly intact.   Procedure: Bilateral cerumen disimpaction Anesthesia: None Description: Under the operating microscope, the cerumen  is carefully removed with a combination of cerumen currette, alligator forceps, and suction catheters.  After the cerumen is removed, the TMs are noted to be normal.  No mass, erythema, or lesions. The patient tolerated the procedure well.   Assessment: 1.  Bilateral recurrent cerumen impaction.  After the cerumen disimpaction procedure, both tympanic membranes and middle ear spaces are noted to be normal. 2.  Bilateral sensorineural hearing loss.  The patient was recently fitted with bilateral hearing aids. 3.  Bilateral subjective tinnitus.  Plan: 1.  Otomicroscopy with bilateral cerumen disimpaction. 2.  The physical exam findings are reviewed with the patient. 3.  Continue the use of her hearing aids. 4.  The patient will return for reevaluation in 4 months.

## 2024-08-09 DIAGNOSIS — R42 Dizziness and giddiness: Secondary | ICD-10-CM | POA: Diagnosis not present

## 2024-08-09 DIAGNOSIS — Z23 Encounter for immunization: Secondary | ICD-10-CM | POA: Diagnosis not present

## 2024-08-09 DIAGNOSIS — I10 Essential (primary) hypertension: Secondary | ICD-10-CM | POA: Diagnosis not present

## 2024-08-11 NOTE — Progress Notes (Signed)
  [address], [address] [place], [place] [address] [phone]  Hearing Aid Check     Kendra Cochran comes for a scheduled appointment for a hearing aid check.     Chief complaint: Patient reports needs her hearing aids to be cleaned.  Actions taken: Inspection of the device and listening check showed that both worked well after cleaning them.  Services fee: $0 was paid at checkout. Patient si ware that future hearing aid service appointments will  have a service fee.    Recommend: Return for a hearing aid check , as needed. Return for a hearing evaluation and to see an ENT, if concerns with hearing changes arise.    Schae Cando MARIE LEROUX-MARTINEZ, AUD

## 2024-08-22 DIAGNOSIS — M810 Age-related osteoporosis without current pathological fracture: Secondary | ICD-10-CM | POA: Diagnosis not present

## 2024-08-22 DIAGNOSIS — E7800 Pure hypercholesterolemia, unspecified: Secondary | ICD-10-CM | POA: Diagnosis not present

## 2024-08-22 DIAGNOSIS — J4520 Mild intermittent asthma, uncomplicated: Secondary | ICD-10-CM | POA: Diagnosis not present

## 2024-08-22 DIAGNOSIS — I10 Essential (primary) hypertension: Secondary | ICD-10-CM | POA: Diagnosis not present

## 2024-12-05 ENCOUNTER — Other Ambulatory Visit (INDEPENDENT_AMBULATORY_CARE_PROVIDER_SITE_OTHER): Payer: Self-pay | Admitting: Otolaryngology

## 2024-12-05 ENCOUNTER — Encounter (INDEPENDENT_AMBULATORY_CARE_PROVIDER_SITE_OTHER): Payer: Self-pay | Admitting: Otolaryngology

## 2024-12-05 ENCOUNTER — Ambulatory Visit (INDEPENDENT_AMBULATORY_CARE_PROVIDER_SITE_OTHER): Admitting: Otolaryngology

## 2024-12-05 ENCOUNTER — Ambulatory Visit (INDEPENDENT_AMBULATORY_CARE_PROVIDER_SITE_OTHER): Payer: Self-pay | Admitting: Audiology

## 2024-12-05 VITALS — BP 118/71 | Temp 79.0°F

## 2024-12-05 DIAGNOSIS — J300 Vasomotor rhinitis: Secondary | ICD-10-CM | POA: Insufficient documentation

## 2024-12-05 DIAGNOSIS — H903 Sensorineural hearing loss, bilateral: Secondary | ICD-10-CM

## 2024-12-05 DIAGNOSIS — H9313 Tinnitus, bilateral: Secondary | ICD-10-CM

## 2024-12-05 DIAGNOSIS — H6123 Impacted cerumen, bilateral: Secondary | ICD-10-CM | POA: Diagnosis not present

## 2024-12-05 DIAGNOSIS — R0982 Postnasal drip: Secondary | ICD-10-CM | POA: Diagnosis not present

## 2024-12-05 DIAGNOSIS — H9193 Unspecified hearing loss, bilateral: Secondary | ICD-10-CM

## 2024-12-05 MED ORDER — IPRATROPIUM BROMIDE 0.06 % NA SOLN: 2.0000 | Freq: Two times a day (BID) | NASAL | 12 refills | Status: AC | PRN

## 2024-12-05 NOTE — Progress Notes (Signed)
 Patient ID: Kendra Cochran, female   DOB: [birth year], 84 y.o.   MRN: [id number]  Follow up: Hearing loss, nasal drainage, recurrent cerumen impaction  History of Present Illness Kendra Cochran is an 84 year old female with bilateral sensorineural hearing loss, tinnitus, and recurrent cerumen impaction who presents today complaining of chronic nasal drainage.  She complains of chronic nasal drainage, sometimes triggered by eating.  She has been symptomatic for more than 1 year.  She initially attributed to cold weather, with symptoms sometimes triggered by eating and associated with a sensation of postnasal drip. She denies history of allergies, facial pain, or fever.  She uses hearing aids and experiences intermittent tinnitus, including hearing music or noise when removing her hearing aids. She has recurrent cerumen impaction requiring periodic removal, with significant cerumen present today.  Exam: General: Communicates without difficulty, well nourished, no acute distress. Head: Normocephalic, no evidence injury, no tenderness, facial buttresses intact without stepoff. Face/sinus: No tenderness to palpation and percussion. Facial movement is normal and symmetric. Eyes: PERRL, EOMI. No scleral icterus, conjunctivae clear. Neuro: CN II exam reveals vision grossly intact.  No nystagmus at any point of gaze. Ears: Auricles well formed without lesions.  Bilateral cerumen impaction.  Nose: External evaluation reveals normal support and skin without lesions.  Dorsum is intact.  Anterior rhinoscopy reveals congested mucosa over anterior aspect of inferior turbinates and intact septum.  No purulence noted. Oral:  Oral cavity and oropharynx are intact, symmetric, without erythema or edema.  Mucosa is moist without lesions. Neck: Full range of motion without pain.  There is no significant lymphadenopathy.  No masses palpable.  Thyroid bed within normal limits to palpation.  Parotid glands and submandibular  glands equal bilaterally without mass.  Trachea is midline. Neuro:  CN 2-12 grossly intact.   Procedure: Bilateral cerumen disimpaction Anesthesia: None Description: Under the operating microscope, the cerumen is carefully removed with a combination of cerumen currette, alligator forceps, and suction catheters.  After the cerumen is removed, the TMs are noted to be normal.  No mass, erythema, or lesions. The patient tolerated the procedure well.    Assessment & Plan Bilateral recurrent cerumen impaction. - Otomicroscopy with bilateral cerumen disimpaction. - Recommended follow-up in approximately four months.  Vasomotor rhinitis with chronic postnasal drainage She experiences persistent rhinorrhea, particularly triggered by gustatory and temperature stimuli, consistent with vasomotor rhinitis.  - Prescribed ipratropium bromide  nasal spray, two sprays in each nostril as needed, up to twice daily. - Provided education regarding nasal spray administration and expected outcomes. - Sent prescription to her preferred pharmacy.  Hearing loss and tinnitus - Hearing test today.

## 2024-12-05 NOTE — Progress Notes (Signed)
" °  [address], [address] [place], [place] [address] [phone]  Hearing Aid Check     SUNJAI LEVANDOSKI comes for a scheduled appointment for a hearing aid check.  Time in:3:40PM Time out:4:15PM Accompanied ab:[id number]     Right Left  Hearing aid manufacturer Renaldo Oda OGLE  SN: [id number] Renaldo Oda OGLE  SN: [id number]  Hearing aid style      Hearing aid battery rechargeable rechargeable  Receiver      Dome/ custom earpiece      Retention wire      Warranty expiration date 02-23-2023 02-23-2023  Loss and Damage expired expired  Initial fitting date 12-05-2020 12-05-2020  [facility]        Actions taken: Inspection of the device and listening check showed that both needed to be cleaned. Cleaned both aids. How to use the app was explained thoroughly by Dr. Lacinda . An additional pack of filters was provided to the patient.  Services fee: $30 was paid at checkout.  Patient was oriented about the importance of considering completing real ear measurements with her hearing aids. The patient says she feels she hears better and can consider it next time. Patient is aware it has an additional fee of $30.  Recommend: Return for a hearing aid check ,as needed. Return for a hearing evaluation and to see an ENT, if concerns with hearing changes arise.   Tamila Gaulin MARIE LEROUX-MARTINEZ, AUD "

## 2025-04-03 ENCOUNTER — Ambulatory Visit (INDEPENDENT_AMBULATORY_CARE_PROVIDER_SITE_OTHER): Admitting: Audiology

## 2025-04-03 ENCOUNTER — Ambulatory Visit (INDEPENDENT_AMBULATORY_CARE_PROVIDER_SITE_OTHER): Admitting: Otolaryngology
